# Patient Record
Sex: Female | Born: 1955 | Race: Black or African American | Hispanic: No | Smoking: Former smoker
Health system: Southern US, Community
[De-identification: ages and names within clinical notes are randomized; demographics above are authoritative.]

## PROBLEM LIST (undated history)

## (undated) DIAGNOSIS — T8859XA Other complications of anesthesia, initial encounter: Secondary | ICD-10-CM

## (undated) DIAGNOSIS — F419 Anxiety disorder, unspecified: Secondary | ICD-10-CM

## (undated) DIAGNOSIS — F329 Major depressive disorder, single episode, unspecified: Secondary | ICD-10-CM

## (undated) DIAGNOSIS — T4145XA Adverse effect of unspecified anesthetic, initial encounter: Secondary | ICD-10-CM

## (undated) DIAGNOSIS — R011 Cardiac murmur, unspecified: Secondary | ICD-10-CM

## (undated) DIAGNOSIS — E669 Obesity, unspecified: Secondary | ICD-10-CM

## (undated) DIAGNOSIS — Z9889 Other specified postprocedural states: Secondary | ICD-10-CM

## (undated) DIAGNOSIS — F32A Depression, unspecified: Secondary | ICD-10-CM

## (undated) DIAGNOSIS — R112 Nausea with vomiting, unspecified: Secondary | ICD-10-CM

## (undated) DIAGNOSIS — G709 Myoneural disorder, unspecified: Secondary | ICD-10-CM

## (undated) DIAGNOSIS — M1990 Unspecified osteoarthritis, unspecified site: Secondary | ICD-10-CM

## (undated) DIAGNOSIS — I10 Essential (primary) hypertension: Secondary | ICD-10-CM

## (undated) HISTORY — DX: Obesity, unspecified: E66.9

## (undated) HISTORY — PX: TUBAL LIGATION: SHX77

## (undated) HISTORY — PX: ABDOMINAL HYSTERECTOMY: SHX81

## (undated) HISTORY — PX: KNEE ARTHROSCOPY: SUR90

## (undated) HISTORY — PX: DILATION AND CURETTAGE OF UTERUS: SHX78

---

## 2003-11-28 ENCOUNTER — Other Ambulatory Visit: Admission: RE | Admit: 2003-11-28 | Discharge: 2003-11-28 | Payer: Self-pay | Admitting: Obstetrics and Gynecology

## 2004-01-03 ENCOUNTER — Encounter: Admission: RE | Admit: 2004-01-03 | Discharge: 2004-01-03 | Payer: Self-pay | Admitting: Obstetrics and Gynecology

## 2004-01-16 ENCOUNTER — Ambulatory Visit (HOSPITAL_COMMUNITY): Admission: RE | Admit: 2004-01-16 | Discharge: 2004-01-16 | Payer: Self-pay | Admitting: Interventional Radiology

## 2004-02-12 ENCOUNTER — Observation Stay (HOSPITAL_COMMUNITY): Admission: RE | Admit: 2004-02-12 | Discharge: 2004-02-13 | Payer: Self-pay | Admitting: Interventional Radiology

## 2004-09-12 ENCOUNTER — Encounter: Admission: RE | Admit: 2004-09-12 | Discharge: 2004-09-12 | Payer: Self-pay | Admitting: Obstetrics and Gynecology

## 2004-09-12 ENCOUNTER — Encounter: Admission: RE | Admit: 2004-09-12 | Discharge: 2004-09-12 | Payer: Self-pay | Admitting: Family Medicine

## 2005-11-17 ENCOUNTER — Encounter: Admission: RE | Admit: 2005-11-17 | Discharge: 2005-11-17 | Payer: Self-pay | Admitting: Obstetrics and Gynecology

## 2006-11-25 ENCOUNTER — Encounter: Admission: RE | Admit: 2006-11-25 | Discharge: 2006-11-25 | Payer: Self-pay | Admitting: Obstetrics and Gynecology

## 2007-03-15 ENCOUNTER — Emergency Department (HOSPITAL_COMMUNITY): Admission: EM | Admit: 2007-03-15 | Discharge: 2007-03-15 | Payer: Self-pay | Admitting: Emergency Medicine

## 2008-02-08 ENCOUNTER — Encounter: Admission: RE | Admit: 2008-02-08 | Discharge: 2008-02-08 | Payer: Self-pay | Admitting: Obstetrics and Gynecology

## 2008-02-29 LAB — HM PAP SMEAR

## 2009-04-11 ENCOUNTER — Encounter: Admission: RE | Admit: 2009-04-11 | Discharge: 2009-04-11 | Payer: Self-pay | Admitting: Internal Medicine

## 2010-01-26 ENCOUNTER — Inpatient Hospital Stay (HOSPITAL_COMMUNITY): Admission: AD | Admit: 2010-01-26 | Discharge: 2010-01-26 | Payer: Self-pay | Admitting: Obstetrics & Gynecology

## 2010-04-23 ENCOUNTER — Encounter: Admission: RE | Admit: 2010-04-23 | Discharge: 2010-04-23 | Payer: Self-pay | Admitting: Obstetrics and Gynecology

## 2010-09-20 ENCOUNTER — Encounter: Payer: Self-pay | Admitting: Interventional Radiology

## 2010-09-21 ENCOUNTER — Encounter: Payer: Self-pay | Admitting: Obstetrics and Gynecology

## 2010-09-22 ENCOUNTER — Encounter: Payer: Self-pay | Admitting: Obstetrics and Gynecology

## 2010-10-06 ENCOUNTER — Other Ambulatory Visit: Payer: Self-pay | Admitting: Obstetrics and Gynecology

## 2010-10-06 DIAGNOSIS — R102 Pelvic and perineal pain: Secondary | ICD-10-CM

## 2010-10-10 ENCOUNTER — Other Ambulatory Visit: Payer: Self-pay

## 2010-10-18 ENCOUNTER — Ambulatory Visit
Admission: RE | Admit: 2010-10-18 | Discharge: 2010-10-18 | Disposition: A | Discharge status: Home / Self Care | Payer: 59 | Source: Ambulatory Visit | Attending: Obstetrics and Gynecology | Admitting: Obstetrics and Gynecology

## 2010-10-18 DIAGNOSIS — R102 Pelvic and perineal pain: Secondary | ICD-10-CM

## 2010-10-18 MED ORDER — GADOBENATE DIMEGLUMINE 529 MG/ML IV SOLN
20.0000 mL | Freq: Once | INTRAVENOUS | Status: AC | PRN
  Administered 2010-10-18: 20 mL via INTRAVENOUS

## 2010-11-04 ENCOUNTER — Other Ambulatory Visit (HOSPITAL_COMMUNITY): Payer: 59

## 2010-11-11 ENCOUNTER — Encounter (HOSPITAL_COMMUNITY)
Admission: RE | Admit: 2010-11-11 | Discharge: 2010-11-11 | Disposition: A | Discharge status: Home / Self Care | Payer: 59 | Source: Ambulatory Visit | Attending: Obstetrics and Gynecology | Admitting: Obstetrics and Gynecology

## 2010-11-11 LAB — SURGICAL PCR SCREEN
MRSA, PCR: NEGATIVE
Staphylococcus aureus: NEGATIVE

## 2010-11-11 LAB — BASIC METABOLIC PANEL
BUN: 9 mg/dL (ref 6–23)
GFR calc Af Amer: >60 mL/min (ref >60)
Potassium: 3.3 mEq/L — ABNORMAL LOW (ref 3.5–5.1)
Sodium: 136 mEq/L (ref 135–145)

## 2010-11-11 LAB — CBC
HCT: 37.2 % (ref 36.0–46.0)
MCV: 79.8 fL (ref 78.0–100.0)
Platelets: 204 10*3/uL (ref 150–400)
RDW: 14.2 % (ref 11.5–15.5)

## 2010-11-17 ENCOUNTER — Ambulatory Visit (HOSPITAL_COMMUNITY)
Admission: RE | Admit: 2010-11-17 | Discharge: 2010-11-18 | Disposition: A | Discharge status: Home / Self Care | Payer: 59 | Source: Ambulatory Visit | Attending: Obstetrics and Gynecology | Admitting: Obstetrics and Gynecology

## 2010-11-17 ENCOUNTER — Other Ambulatory Visit: Payer: Self-pay | Admitting: Obstetrics and Gynecology

## 2010-11-17 DIAGNOSIS — Z01818 Encounter for other preprocedural examination: Secondary | ICD-10-CM | POA: Insufficient documentation

## 2010-11-17 DIAGNOSIS — D251 Intramural leiomyoma of uterus: Secondary | ICD-10-CM | POA: Insufficient documentation

## 2010-11-17 DIAGNOSIS — R1031 Right lower quadrant pain: Secondary | ICD-10-CM | POA: Insufficient documentation

## 2010-11-17 DIAGNOSIS — Z01812 Encounter for preprocedural laboratory examination: Secondary | ICD-10-CM | POA: Insufficient documentation

## 2010-11-17 LAB — URINE MICROSCOPIC-ADD ON

## 2010-11-17 LAB — URINALYSIS, ROUTINE W REFLEX MICROSCOPIC
Bilirubin Urine: NEGATIVE
Nitrite: NEGATIVE
Specific Gravity, Urine: 1.02 (ref 1.005–1.030)
Urobilinogen, UA: 0.2 mg/dL (ref 0.0–1.0)

## 2010-11-18 LAB — GLUCOSE, CAPILLARY
Glucose-Capillary: 184 mg/dL — ABNORMAL HIGH (ref 70–99)
Glucose-Capillary: 193 mg/dL — ABNORMAL HIGH (ref 70–99)

## 2010-11-18 LAB — BASIC METABOLIC PANEL
CO2: 27 mEq/L (ref 19–32)
Calcium: 8.6 mg/dL (ref 8.4–10.5)
Chloride: 104 mEq/L (ref 96–112)
GFR calc Af Amer: >60 mL/min (ref >60)
Potassium: 3.5 mEq/L (ref 3.5–5.1)
Sodium: 137 mEq/L (ref 135–145)

## 2010-11-18 LAB — CBC
Hemoglobin: 10.5 g/dL — ABNORMAL LOW (ref 12.0–15.0)
MCHC: 33.1 g/dL (ref 30.0–36.0)
RBC: 3.94 MIL/uL (ref 3.87–5.11)
WBC: 9.6 10*3/uL (ref 4.0–10.5)

## 2010-12-11 NOTE — Op Note (Signed)
NAME:  Connie Nichols, Connie Nichols             ACCOUNT NO.:  [id number]  MEDICAL RECORD NO.:  [id number]           PATIENT TYPE:  O  LOCATION:  9315                          FACILITY:  [facility]  PHYSICIAN:  Maxie Better, M.D.DATE OF BIRTH:  1956/01/10  DATE OF PROCEDURE:  11/17/2010 DATE OF DISCHARGE:                              OPERATIVE REPORT   PREOPERATIVE DIAGNOSES:  Chronic right lower quadrant pain, fibroid uterus.  POSTOPERATIVE DIAGNOSES:  Chronic right lower quadrant pain, parasitic right fibroid, fibroid uterus.  PROCEDURES:  Davinci robotic total laparoscopic hysterectomy, myomectomy.  SURGEON:  Maxie Better, MD  ASSISTANT:  Genia Del, MD  ANESTHESIA:  General.  PROCEDURE:  Under adequate general anesthesia, the patient was placed in dorsal lithotomy position.  She was sterilely prepped and draped in usual fashion.  An indwelling Foley catheter was sterilely placed. Weighted speculum was placed in the vagina.  Sims retractor was used anteriorly.  The cervix measured in width 3.5 cm.  The anterior lip of the cervix was grasped with a single-tooth tenaculum.  The uterus sounded to 7 cm.  The cervix was able to accept a 22 Pratt dilator. Based on the measurements, a medium size Rumi KOH ring was utilized and attached to a #6 introducer and this was then inserted into the uterine cavity using a new Rumi apparatus.  The ring was able to be seated around the cervix very nicely.  The balloon was filled.  Intrauterine instruments were removed from the vagina.  Attention was then turned to the abdomen maintaining still sterile technique.  The patient has been draped.  A supraumbilical transverse incision was then made and 0.25% Marcaine had been injected for local.  Incision was then made.  Veress needle was introduced and tested.  Opening pressure of 6 was noted.  3.5 L of CO2 was insufflated.  Veress needle was then removed.  A 12-mm trocar with sleeve was  introduced into the abdomen without incident. The robotic camera was then introduced through that port.  The anterior abdominal wall was inspected, the patient was placed in Trendelenburg. With manipulation of the instruments in the uterus,the uterus was then noted to have slightly enlarged and clearly a large fibroid proximal to the uterus and on the right lateral sidewall separate from the right tube and ovary. Once the upper abdomen was noted to be normal, normal liver edge and gallbladder was seen.  The additional ports were then placed based on the fibroid that was separate and two 8-mm ports were placed onto the left of the camera site and to the right where another 8 mm and distal to that a slightly inferior was a 12 mm assistant port; all placed under direct visualization.  At that point, the robot was then brought to the patient's left side, docked to these ports and the PK dissector, the Prograsper for the third arm and the monopolar scissors were placed to the level of where the operating sites would be.  At that point, I then proceeded to the surgical console and the pelvis was again inspected. It was then noted that the uterus had been having some fibroids.  Both  ovaries were normal.  The left tube was obscured by omental adhesions overlying the left side.  There was clearly evidence of surgical separation of the tubes in the past.  The posterior cul-de-sac was without any lesions.  The anterior cul-de-sac also without any lesions. The procedure started by using the PK dissector to clamp the right utero- ovarian ligament cauterizing and then subsequently cutting with the scissors.  The ureters had been seen bilaterally and was deep in the pelvis.  The round ligament was subsequently also clamped, cauterized, and cut.  This was continued to the level of the bladder reflection. The large fibroid that was separate was left to be removed at a separate time.  The vesicouterine  peritoneum was then opened transversely.  The cup was kept in the field in terms of the location with respect to the bladder.  Sharp and blunt dissection of the bladder was then performed on the opposite side.  The adhesions were then first lysed off the left aspect of the pelvis, resulting in the left tube and ovary being seen more clearly.  The left utero-ovarian ligaments were then sequentially clamped, cauterized, and then cut as well as the round and this was carried down also the bladder reflection and the bladder reflection was then completely opened at that point.  The bladder was then dissected out further off the lower uterine segment.  Once this was done, the uterine vessels were bilaterally isolated, they was serially clamped, cauterized, and ultimately cut.  Once the uterus was noted to be dusky, the bladder was downed further enough, the balloon was insufflated in the vagina to continue the pneumoperitoneum.  Scissor was then used to transect the cervix from its vaginal attachment circumferentially.  Once that was removed, the balloon was deflated and the uterus was then brought into the vagina to continue the pneumoperitoneum.  The vaginal cuff was inspected and bleeders was noted to be minimal.  At that point, attention was then turned to this isolated fibroid.  A tenaculum forceps was then used to grasp the fibroid, the hot scissors was then used to open the peritoneal surface, and dissection was then performed with adjustment of the tenaculum to further pull the fibroid into the field. At the base, there was a large vessel, which was clamped, cauterized, and cut and after that was done, the remaining portion of the fibroid attachment was then severed.  Good hemostasis noted at that point.  The uterus was removed.  The fibroid was then placed through the vaginal cuff and with  pushing from above and tenaculum from the vaginal region, the fibroid was then removed.  After  that was done, the vaginal cuff was then closed with figure-of-eight 0-Vicryl sutures.  The pelvis was then irrigated and suctioned.  Good hemostasis was noted.  At that point, the instruments were then removed, the robot was undocked, the abdomen was partially deflated.  The camera remained and was inspected.  The pelvis once again confirmed good hemostasis.  The ports were then removed under direct visualization and supraumbilical site was also removed.  The sites were then closed with 0-Vicryl fascial stitch, figure-of-eights, and the skin approximated with 4-0 Vicryl subcuticular sutures and Dermabond. Specimen was uterus and cervix and myoma.  The myoma itself weighed 100 g, all sent to Pathology.  Estimated blood loss was 50 mL. Intraoperative fluid 1900 mL.  Urine output 300 mL of clear urine. Sponge and instrument counts were correct.  Complication was none.  The patient  tolerated the procedure well and was transferred to recovery room in stable condition.     Maxie Better, M.D.     [place]  D:  11/17/2010  T:  11/18/2010  Job:  [id number]  Electronically Signed by Nena Jordan Daryl Quiros M.D. on 12/11/2010 12:59:23 AM

## 2011-06-11 ENCOUNTER — Other Ambulatory Visit: Payer: Self-pay | Admitting: Obstetrics and Gynecology

## 2011-06-11 DIAGNOSIS — Z1231 Encounter for screening mammogram for malignant neoplasm of breast: Secondary | ICD-10-CM

## 2011-06-15 LAB — DIFFERENTIAL
Basophils Absolute: 0
Lymphocytes Relative: 45
Lymphs Abs: 2.2
Monocytes Absolute: 0.4
Neutro Abs: 2.2

## 2011-06-15 LAB — URINE MICROSCOPIC-ADD ON

## 2011-06-15 LAB — URINALYSIS, ROUTINE W REFLEX MICROSCOPIC
Nitrite: NEGATIVE
Specific Gravity, Urine: 1.02
Urobilinogen, UA: 0.2

## 2011-06-15 LAB — I-STAT 8, (EC8 V) (CONVERTED LAB)
BUN: 11
Glucose, Bld: 155 — ABNORMAL HIGH
Hemoglobin: 12.6
Potassium: 3.4 — ABNORMAL LOW
Sodium: 139

## 2011-06-15 LAB — CBC
Hemoglobin: 12.1
RDW: 13.7
WBC: 4.9

## 2011-06-15 LAB — POCT I-STAT CREATININE: Creatinine, Ser: 0.7

## 2011-06-15 LAB — D-DIMER, QUANTITATIVE: D-Dimer, Quant: 0.94 — ABNORMAL HIGH

## 2011-06-25 ENCOUNTER — Ambulatory Visit: Payer: 59

## 2011-07-14 ENCOUNTER — Ambulatory Visit
Admission: RE | Admit: 2011-07-14 | Discharge: 2011-07-14 | Disposition: A | Discharge status: Home / Self Care | Payer: 59 | Source: Ambulatory Visit | Attending: Obstetrics and Gynecology | Admitting: Obstetrics and Gynecology

## 2011-07-14 DIAGNOSIS — Z1231 Encounter for screening mammogram for malignant neoplasm of breast: Secondary | ICD-10-CM

## 2011-07-28 ENCOUNTER — Other Ambulatory Visit: Payer: Self-pay | Admitting: Internal Medicine

## 2011-08-12 ENCOUNTER — Other Ambulatory Visit: Payer: Self-pay | Admitting: Neurological Surgery

## 2011-08-13 ENCOUNTER — Encounter (HOSPITAL_COMMUNITY): Payer: Self-pay

## 2011-08-20 ENCOUNTER — Encounter (HOSPITAL_COMMUNITY): Payer: Self-pay

## 2011-08-20 ENCOUNTER — Encounter (HOSPITAL_COMMUNITY)
Admission: RE | Admit: 2011-08-20 | Discharge: 2011-08-20 | Disposition: A | Discharge status: Home / Self Care | Payer: 59 | Source: Ambulatory Visit | Attending: Neurological Surgery | Admitting: Neurological Surgery

## 2011-08-20 ENCOUNTER — Other Ambulatory Visit: Payer: Self-pay

## 2011-08-20 HISTORY — DX: Unspecified osteoarthritis, unspecified site: M19.90

## 2011-08-20 HISTORY — DX: Other specified postprocedural states: R11.2

## 2011-08-20 HISTORY — DX: Adverse effect of unspecified anesthetic, initial encounter: T41.45XA

## 2011-08-20 HISTORY — DX: Other specified postprocedural states: Z98.890

## 2011-08-20 HISTORY — DX: Cardiac murmur, unspecified: R01.1

## 2011-08-20 HISTORY — DX: Major depressive disorder, single episode, unspecified: F32.9

## 2011-08-20 HISTORY — DX: Depression, unspecified: F32.A

## 2011-08-20 HISTORY — DX: Essential (primary) hypertension: I10

## 2011-08-20 HISTORY — DX: Anxiety disorder, unspecified: F41.9

## 2011-08-20 HISTORY — DX: Myoneural disorder, unspecified: G70.9

## 2011-08-20 HISTORY — DX: Other complications of anesthesia, initial encounter: T88.59XA

## 2011-08-20 LAB — BASIC METABOLIC PANEL
BUN: 8 mg/dL (ref 6–23)
Chloride: 100 mEq/L (ref 96–112)
GFR calc Af Amer: >90 mL/min (ref >90)
Potassium: 3.7 mEq/L (ref 3.5–5.1)
Sodium: 139 mEq/L (ref 135–145)

## 2011-08-20 LAB — CBC
HCT: 36.8 % (ref 36.0–46.0)
Hemoglobin: 12.7 g/dL (ref 12.0–15.0)
RBC: 4.67 MIL/uL (ref 3.87–5.11)
WBC: 6.1 10*3/uL (ref 4.0–10.5)

## 2011-08-20 LAB — SURGICAL PCR SCREEN: Staphylococcus aureus: NEGATIVE

## 2011-08-20 MED ORDER — CEFAZOLIN SODIUM 1-5 GM-% IV SOLN: 1.0000 g | INTRAVENOUS | Status: DC

## 2011-08-20 NOTE — Pre-Procedure Instructions (Signed)
20 Connie Nichols  08/20/2011   Your procedure is scheduled on:  1/08.2013  Report to [facility] at 1230 AM.  Call this number if you have problems the morning of surgery: [phone]   Remember:   Do not eat food:After Midnight.  May have clear liquids: up to 4 Hours before arrival.  Clear liquids include soda, tea, black coffee, apple or grape juice, broth.  Take these medicines the morning of surgery with A SIP OF WATER: AMLODIPINE  DIOVAN   Do not wear jewelry, make-up or nail polish.  Do not wear lotions, powders, or perfumes. You may wear deodorant.  Do not shave 48 hours prior to surgery.  Do not bring valuables to the hospital.  Contacts, dentures or bridgework may not be worn into surgery.  Leave suitcase in the car. After surgery it may be brought to your room.  For patients admitted to the hospital, checkout time is 11:00 AM the day of discharge.   Patients discharged the day of surgery will not be allowed to drive home.  Name and phone number of your driver: FAMILY  Special Instructions: CHG Shower Use Special Wash: 1/2 bottle night before surgery and 1/2 bottle morning of surgery.   Please read over the following fact sheets that you were given: Pain Booklet, Coughing and Deep Breathing, MRSA Information and Surgical Site Infection Prevention

## 2011-08-20 NOTE — Progress Notes (Signed)
Called pt at home #  No ans  L.m. To call me if not coming for pre appt.

## 2011-09-08 ENCOUNTER — Ambulatory Visit (HOSPITAL_COMMUNITY): Payer: 59 | Admitting: Anesthesiology

## 2011-09-08 ENCOUNTER — Encounter (HOSPITAL_COMMUNITY): Payer: Self-pay | Admitting: Anesthesiology

## 2011-09-08 ENCOUNTER — Encounter (HOSPITAL_COMMUNITY)
Admission: RE | Disposition: A | Discharge status: Home / Self Care | Payer: Self-pay | Source: Ambulatory Visit | Attending: Neurological Surgery

## 2011-09-08 ENCOUNTER — Ambulatory Visit (HOSPITAL_COMMUNITY)
Admission: RE | Admit: 2011-09-08 | Discharge: 2011-09-09 | Disposition: A | Discharge status: Home / Self Care | Payer: 59 | Source: Ambulatory Visit | Attending: Neurological Surgery | Admitting: Neurological Surgery

## 2011-09-08 ENCOUNTER — Ambulatory Visit (HOSPITAL_COMMUNITY): Payer: 59

## 2011-09-08 DIAGNOSIS — K7460 Unspecified cirrhosis of liver: Secondary | ICD-10-CM | POA: Insufficient documentation

## 2011-09-08 DIAGNOSIS — Z23 Encounter for immunization: Secondary | ICD-10-CM | POA: Insufficient documentation

## 2011-09-08 DIAGNOSIS — Z01812 Encounter for preprocedural laboratory examination: Secondary | ICD-10-CM | POA: Insufficient documentation

## 2011-09-08 DIAGNOSIS — F411 Generalized anxiety disorder: Secondary | ICD-10-CM | POA: Insufficient documentation

## 2011-09-08 DIAGNOSIS — I10 Essential (primary) hypertension: Secondary | ICD-10-CM | POA: Insufficient documentation

## 2011-09-08 DIAGNOSIS — Z01810 Encounter for preprocedural cardiovascular examination: Secondary | ICD-10-CM | POA: Insufficient documentation

## 2011-09-08 DIAGNOSIS — F3289 Other specified depressive episodes: Secondary | ICD-10-CM | POA: Insufficient documentation

## 2011-09-08 DIAGNOSIS — M4712 Other spondylosis with myelopathy, cervical region: Secondary | ICD-10-CM

## 2011-09-08 DIAGNOSIS — E119 Type 2 diabetes mellitus without complications: Secondary | ICD-10-CM | POA: Insufficient documentation

## 2011-09-08 DIAGNOSIS — Z01818 Encounter for other preprocedural examination: Secondary | ICD-10-CM | POA: Insufficient documentation

## 2011-09-08 DIAGNOSIS — F329 Major depressive disorder, single episode, unspecified: Secondary | ICD-10-CM | POA: Insufficient documentation

## 2011-09-08 HISTORY — PX: ANTERIOR CERVICAL DECOMP/DISCECTOMY FUSION: SHX1161

## 2011-09-08 LAB — GLUCOSE, CAPILLARY
Glucose-Capillary: 135 mg/dL — ABNORMAL HIGH (ref 70–99)
Glucose-Capillary: 178 mg/dL — ABNORMAL HIGH (ref 70–99)

## 2011-09-08 SURGERY — ANTERIOR CERVICAL DECOMPRESSION/DISCECTOMY FUSION 2 LEVELS
Anesthesia: General | Site: Neck | Wound class: Clean

## 2011-09-08 MED ORDER — VANCOMYCIN HCL IN DEXTROSE 1-5 GM/200ML-% IV SOLN
INTRAVENOUS | Status: AC
  Filled 2011-09-08: qty 200

## 2011-09-08 MED ORDER — FENTANYL CITRATE 0.05 MG/ML IJ SOLN
25.0000 ug | INTRAMUSCULAR | Status: DC | PRN
  Administered 2011-09-08 (×3): 25 ug via INTRAVENOUS
  Administered 2011-09-08: 50 ug via INTRAVENOUS
  Administered 2011-09-08: 25 ug via INTRAVENOUS

## 2011-09-08 MED ORDER — SODIUM CHLORIDE 0.9 % IV SOLN
INTRAVENOUS | Status: AC
  Filled 2011-09-08: qty 500

## 2011-09-08 MED ORDER — NEOSTIGMINE METHYLSULFATE 1 MG/ML IJ SOLN
INTRAMUSCULAR | Status: DC | PRN
  Administered 2011-09-08: 3 mg via INTRAVENOUS

## 2011-09-08 MED ORDER — FENTANYL CITRATE 0.05 MG/ML IJ SOLN
INTRAMUSCULAR | Status: AC
  Filled 2011-09-08: qty 2

## 2011-09-08 MED ORDER — LIDOCAINE-EPINEPHRINE 1 %-1:100000 IJ SOLN
INTRAMUSCULAR | Status: DC | PRN
  Administered 2011-09-08: 20 mL

## 2011-09-08 MED ORDER — INFLUENZA VIRUS VACC SPLIT PF IM SUSP
0.5000 mL | INTRAMUSCULAR | Status: AC
  Administered 2011-09-09: 0.5 mL via INTRAMUSCULAR
  Filled 2011-09-08: qty 0.5

## 2011-09-08 MED ORDER — FENTANYL CITRATE 0.05 MG/ML IJ SOLN
INTRAMUSCULAR | Status: DC | PRN
  Administered 2011-09-08: 50 ug via INTRAVENOUS
  Administered 2011-09-08: 100 ug via INTRAVENOUS
  Administered 2011-09-08: 50 ug via INTRAVENOUS

## 2011-09-08 MED ORDER — SODIUM CHLORIDE 0.9 % IR SOLN
Status: DC | PRN
  Administered 2011-09-08: 15:00:00

## 2011-09-08 MED ORDER — INSULIN ASPART 100 UNIT/ML ~~LOC~~ SOLN
0.0000 [IU] | Freq: Every day | SUBCUTANEOUS | Status: DC
  Administered 2011-09-08: 4 [IU] via SUBCUTANEOUS

## 2011-09-08 MED ORDER — ACETAMINOPHEN 650 MG RE SUPP: 650.0000 mg | RECTAL | Status: DC | PRN

## 2011-09-08 MED ORDER — METFORMIN HCL 500 MG PO TABS
500.0000 mg | ORAL_TABLET | Freq: Every day | ORAL | Status: DC
  Administered 2011-09-08 – 2011-09-09 (×2): 500 mg via ORAL
  Filled 2011-09-08 (×2): qty 1

## 2011-09-08 MED ORDER — ONDANSETRON HCL 4 MG/2ML IJ SOLN
INTRAMUSCULAR | Status: DC | PRN
  Administered 2011-09-08 (×2): 4 mg via INTRAVENOUS

## 2011-09-08 MED ORDER — INSULIN ASPART 100 UNIT/ML ~~LOC~~ SOLN
0.0000 [IU] | Freq: Three times a day (TID) | SUBCUTANEOUS | Status: DC
  Administered 2011-09-09: 3 [IU] via SUBCUTANEOUS
  Filled 2011-09-08: qty 3

## 2011-09-08 MED ORDER — BACITRACIN 50000 UNITS IM SOLR
INTRAMUSCULAR | Status: AC
  Filled 2011-09-08: qty 50000

## 2011-09-08 MED ORDER — PROMETHAZINE HCL 25 MG/ML IJ SOLN
INTRAMUSCULAR | Status: AC
  Filled 2011-09-08: qty 1

## 2011-09-08 MED ORDER — HYDROCODONE-ACETAMINOPHEN 5-325 MG PO TABS
1.0000 | ORAL_TABLET | ORAL | Status: DC | PRN
  Administered 2011-09-08 – 2011-09-09 (×3): 2 via ORAL
  Filled 2011-09-08 (×4): qty 2

## 2011-09-08 MED ORDER — DEXAMETHASONE SODIUM PHOSPHATE 4 MG/ML IJ SOLN
INTRAMUSCULAR | Status: DC | PRN
  Administered 2011-09-08: 8 mg via INTRAVENOUS

## 2011-09-08 MED ORDER — LINAGLIPTIN 5 MG PO TABS
5.0000 mg | ORAL_TABLET | Freq: Every day | ORAL | Status: DC
  Administered 2011-09-08 – 2011-09-09 (×2): 5 mg via ORAL
  Filled 2011-09-08 (×2): qty 1

## 2011-09-08 MED ORDER — OLMESARTAN 10 MG HALF TABLET
10.0000 mg | ORAL_TABLET | Freq: Every day | ORAL | Status: DC
  Administered 2011-09-09: 10 mg via ORAL
  Filled 2011-09-08 (×2): qty 1

## 2011-09-08 MED ORDER — VITAMIN D3 25 MCG (1000 UNIT) PO TABS
1000.0000 [IU] | ORAL_TABLET | Freq: Every day | ORAL | Status: DC
  Administered 2011-09-08 – 2011-09-09 (×2): 1000 [IU] via ORAL
  Filled 2011-09-08 (×2): qty 1

## 2011-09-08 MED ORDER — DIAZEPAM 5 MG PO TABS
5.0000 mg | ORAL_TABLET | Freq: Four times a day (QID) | ORAL | Status: DC | PRN
  Administered 2011-09-09: 5 mg via ORAL
  Filled 2011-09-08: qty 1

## 2011-09-08 MED ORDER — VANCOMYCIN HCL 1000 MG IV SOLR
1000.0000 mg | INTRAVENOUS | Status: DC | PRN
  Administered 2011-09-08: 1000 mg via INTRAVENOUS

## 2011-09-08 MED ORDER — BUPIVACAINE HCL (PF) 0.5 % IJ SOLN
INTRAMUSCULAR | Status: DC | PRN
  Administered 2011-09-08: 30 mL

## 2011-09-08 MED ORDER — 0.9 % SODIUM CHLORIDE (POUR BTL) OPTIME
TOPICAL | Status: DC | PRN
  Administered 2011-09-08: 1000 mL

## 2011-09-08 MED ORDER — PROPOFOL 10 MG/ML IV EMUL
INTRAVENOUS | Status: DC | PRN
  Administered 2011-09-08: 170 mg via INTRAVENOUS

## 2011-09-08 MED ORDER — HEMOSTATIC AGENTS (NO CHARGE) OPTIME
TOPICAL | Status: DC | PRN
  Administered 2011-09-08: 1 via TOPICAL

## 2011-09-08 MED ORDER — PHENYLEPHRINE HCL 10 MG/ML IJ SOLN
INTRAMUSCULAR | Status: DC | PRN
  Administered 2011-09-08 (×4): 80 ug via INTRAVENOUS

## 2011-09-08 MED ORDER — LIDOCAINE HCL (CARDIAC) 20 MG/ML IV SOLN
INTRAVENOUS | Status: DC | PRN
  Administered 2011-09-08: 60 mg via INTRAVENOUS

## 2011-09-08 MED ORDER — SODIUM CHLORIDE 0.9 % IJ SOLN: 3.0000 mL | INTRAMUSCULAR | Status: DC | PRN

## 2011-09-08 MED ORDER — AMLODIPINE BESYLATE 5 MG PO TABS
5.0000 mg | ORAL_TABLET | Freq: Every day | ORAL | Status: DC
  Administered 2011-09-09: 5 mg via ORAL
  Filled 2011-09-08 (×2): qty 1

## 2011-09-08 MED ORDER — THERA M PLUS PO TABS
1.0000 | ORAL_TABLET | Freq: Every day | ORAL | Status: DC
  Administered 2011-09-08 – 2011-09-09 (×2): 1 via ORAL
  Filled 2011-09-08 (×2): qty 1

## 2011-09-08 MED ORDER — MORPHINE SULFATE 4 MG/ML IJ SOLN: 1.0000 mg | INTRAMUSCULAR | Status: DC | PRN

## 2011-09-08 MED ORDER — ROCURONIUM BROMIDE 100 MG/10ML IV SOLN
INTRAVENOUS | Status: DC | PRN
  Administered 2011-09-08: 50 mg via INTRAVENOUS
  Administered 2011-09-08: 20 mg via INTRAVENOUS

## 2011-09-08 MED ORDER — ALUM & MAG HYDROXIDE-SIMETH 200-200-20 MG/5ML PO SUSP: 30.0000 mL | Freq: Four times a day (QID) | ORAL | Status: DC | PRN

## 2011-09-08 MED ORDER — SODIUM CHLORIDE 0.9 % IV SOLN
10.0000 mg | INTRAVENOUS | Status: DC | PRN
  Administered 2011-09-08: 10 ug/min via INTRAVENOUS

## 2011-09-08 MED ORDER — PROMETHAZINE HCL 25 MG/ML IJ SOLN
6.2500 mg | INTRAMUSCULAR | Status: DC | PRN
  Administered 2011-09-08: 6.25 mg via INTRAVENOUS

## 2011-09-08 MED ORDER — SODIUM CHLORIDE 0.9 % IV SOLN: 250.0000 mL | INTRAVENOUS | Status: DC

## 2011-09-08 MED ORDER — GLYCOPYRROLATE 0.2 MG/ML IJ SOLN
INTRAMUSCULAR | Status: DC | PRN
  Administered 2011-09-08: .6 mg via INTRAVENOUS

## 2011-09-08 MED ORDER — MEPERIDINE HCL 25 MG/ML IJ SOLN: 6.2500 mg | INTRAMUSCULAR | Status: DC | PRN

## 2011-09-08 MED ORDER — ACETAMINOPHEN 325 MG PO TABS: 650.0000 mg | ORAL_TABLET | ORAL | Status: DC | PRN

## 2011-09-08 MED ORDER — LACTATED RINGERS IV SOLN
INTRAVENOUS | Status: DC | PRN
  Administered 2011-09-08 (×2): via INTRAVENOUS

## 2011-09-08 MED ORDER — PHENOL 1.4 % MT LIQD: 1.0000 | OROMUCOSAL | Status: DC | PRN

## 2011-09-08 MED ORDER — THROMBIN 5000 UNITS EX KIT
PACK | CUTANEOUS | Status: DC | PRN
  Administered 2011-09-08 (×2): 5000 [IU] via TOPICAL

## 2011-09-08 MED ORDER — ROSUVASTATIN CALCIUM 10 MG PO TABS
10.0000 mg | ORAL_TABLET | Freq: Every day | ORAL | Status: DC
  Administered 2011-09-08 – 2011-09-09 (×2): 10 mg via ORAL
  Filled 2011-09-08 (×2): qty 1

## 2011-09-08 MED ORDER — MIDAZOLAM HCL 5 MG/5ML IJ SOLN
INTRAMUSCULAR | Status: DC | PRN
  Administered 2011-09-08: 2 mg via INTRAVENOUS

## 2011-09-08 MED ORDER — SODIUM CHLORIDE 0.9 % IJ SOLN: 3.0000 mL | Freq: Two times a day (BID) | INTRAMUSCULAR | Status: DC

## 2011-09-08 MED ORDER — MENTHOL 3 MG MT LOZG
1.0000 | LOZENGE | OROMUCOSAL | Status: DC | PRN
  Filled 2011-09-08: qty 9

## 2011-09-08 MED ORDER — LACTATED RINGERS IV SOLN: INTRAVENOUS | Status: DC

## 2011-09-08 MED ORDER — ONDANSETRON HCL 4 MG/2ML IJ SOLN: 4.0000 mg | INTRAMUSCULAR | Status: DC | PRN

## 2011-09-08 SURGICAL SUPPLY — 59 items
ADH SKN CLS APL DERMABOND .7 (GAUZE/BANDAGES/DRESSINGS) ×1
BAG DECANTER FOR FLEXI CONT (MISCELLANEOUS) ×2 IMPLANT
BANDAGE GAUZE ELAST BULKY 4 IN (GAUZE/BANDAGES/DRESSINGS) ×2 IMPLANT
BIT DRILL 2.3 12 FIXED (INSTRUMENTS) IMPLANT
BIT DRILL NEURO 2X3.1 SFT TUCH (MISCELLANEOUS) ×1 IMPLANT
BONE CERVICAL 6MM LRG (Bone Implant) ×2 IMPLANT
BUR BARREL STRAIGHT FLUTE 4.0 (BURR) ×2 IMPLANT
CANISTER SUCTION 2500CC (MISCELLANEOUS) ×2 IMPLANT
CLOTH BEACON ORANGE TIMEOUT ST (SAFETY) ×2 IMPLANT
CONT SPEC 4OZ CLIKSEAL STRL BL (MISCELLANEOUS) ×4 IMPLANT
DECANTER SPIKE VIAL GLASS SM (MISCELLANEOUS) ×1 IMPLANT
DERMABOND ADVANCED (GAUZE/BANDAGES/DRESSINGS) ×1
DERMABOND ADVANCED .7 DNX12 (GAUZE/BANDAGES/DRESSINGS) ×1 IMPLANT
DRAPE LAPAROTOMY 100X72 PEDS (DRAPES) ×2 IMPLANT
DRAPE MICROSCOPE LEICA (MISCELLANEOUS) IMPLANT
DRAPE POUCH INSTRU U-SHP 10X18 (DRAPES) ×2 IMPLANT
DRESSING TELFA 8X3 (GAUZE/BANDAGES/DRESSINGS) ×1 IMPLANT
DRILL 12MM (INSTRUMENTS) ×2
DRILL NEURO 2X3.1 SOFT TOUCH (MISCELLANEOUS) ×2
DRSG OPSITE 4X5.5 SM (GAUZE/BANDAGES/DRESSINGS) ×1 IMPLANT
DURAPREP 6ML APPLICATOR 50/CS (WOUND CARE) ×2 IMPLANT
ELECT REM PT RETURN 9FT ADLT (ELECTROSURGICAL) ×2
ELECTRODE REM PT RTRN 9FT ADLT (ELECTROSURGICAL) ×1 IMPLANT
GAUZE SPONGE 4X4 16PLY XRAY LF (GAUZE/BANDAGES/DRESSINGS) IMPLANT
GLOVE BIO SURGEON STRL SZ7.5 (GLOVE) IMPLANT
GLOVE BIOGEL PI IND STRL 7.5 (GLOVE) IMPLANT
GLOVE BIOGEL PI IND STRL 8.5 (GLOVE) ×1 IMPLANT
GLOVE BIOGEL PI INDICATOR 7.5 (GLOVE) ×2
GLOVE BIOGEL PI INDICATOR 8.5 (GLOVE) ×1
GLOVE ECLIPSE 7.5 STRL STRAW (GLOVE) ×3 IMPLANT
GLOVE ECLIPSE 8.5 STRL (GLOVE) ×2 IMPLANT
GLOVE EXAM NITRILE LRG STRL (GLOVE) IMPLANT
GLOVE EXAM NITRILE MD LF STRL (GLOVE) IMPLANT
GLOVE EXAM NITRILE XL STR (GLOVE) IMPLANT
GLOVE EXAM NITRILE XS STR PU (GLOVE) IMPLANT
GOWN BRE IMP SLV AUR LG STRL (GOWN DISPOSABLE) IMPLANT
GOWN BRE IMP SLV AUR XL STRL (GOWN DISPOSABLE) ×3 IMPLANT
GOWN STRL REIN 2XL LVL4 (GOWN DISPOSABLE) ×2 IMPLANT
HEAD HALTER (SOFTGOODS) ×2 IMPLANT
KIT BASIN OR (CUSTOM PROCEDURE TRAY) ×2 IMPLANT
KIT ROOM TURNOVER OR (KITS) ×2 IMPLANT
NDL SPNL 22GX3.5 QUINCKE BK (NEEDLE) ×1 IMPLANT
NEEDLE HYPO 22GX1.5 SAFETY (NEEDLE) ×2 IMPLANT
NEEDLE SPNL 22GX3.5 QUINCKE BK (NEEDLE) ×4 IMPLANT
NS IRRIG 1000ML POUR BTL (IV SOLUTION) ×2 IMPLANT
PACK LAMINECTOMY NEURO (CUSTOM PROCEDURE TRAY) ×2 IMPLANT
PAD ARMBOARD 7.5X6 YLW CONV (MISCELLANEOUS) ×6 IMPLANT
PLATE 28MM (Plate) ×1 IMPLANT
PUTTY BONE 2.5CC ×1 IMPLANT
RUBBERBAND STERILE (MISCELLANEOUS) IMPLANT
SCREW 12MM (Screw) ×6 IMPLANT
SPONGE GAUZE 4X4 12PLY (GAUZE/BANDAGES/DRESSINGS) ×1 IMPLANT
SPONGE INTESTINAL PEANUT (DISPOSABLE) ×2 IMPLANT
SPONGE SURGIFOAM ABS GEL SZ50 (HEMOSTASIS) ×2 IMPLANT
SUT VIC AB 3-0 SH 8-18 (SUTURE) ×3 IMPLANT
SYR 20ML ECCENTRIC (SYRINGE) ×2 IMPLANT
TOWEL OR 17X24 6PK STRL BLUE (TOWEL DISPOSABLE) ×2 IMPLANT
TOWEL OR 17X26 10 PK STRL BLUE (TOWEL DISPOSABLE) ×2 IMPLANT
WATER STERILE IRR 1000ML POUR (IV SOLUTION) ×2 IMPLANT

## 2011-09-08 NOTE — H&P (Signed)
Patient's films were reviewed with the patient including review of her symptoms. She has disease at C4-5 C5-6 and C6-C7 though at only C4-5 and C5-6 that she exhibit signs of cord compression and foraminal stenosis. She is to have surgical decompression at C4-5 and C5-6.

## 2011-09-08 NOTE — Progress Notes (Signed)
Subjective: Patient reports Stable postop offers no complaints some shoulder pain.  Objective: Vital signs in last 24 hours: Temp:  [97.6 F (36.4 C)-98.1 F (36.7 C)] 97.6 F (36.4 C) (01/08 1710) Pulse Rate:  [81-84] 81  (01/08 1710) Resp:  [15-18] 15  (01/08 1710) BP: (140-159)/(83-84) 140/84 mmHg (01/08 1710) SpO2:  [96 %-100 %] 100 % (01/08 1710)  Intake/Output from previous day:   Intake/Output this shift: Total I/O In: 1000 [I.V.:1000] Out: 50 [Blood:50]  Dressing clean and dry speech intact motor function intact in upper extremities.  Lab Results: No results found for this basename: WBC:2,HGB:2,HCT:2,PLT:2 in the last 72 hours BMET No results found for this basename: NA:2,K:2,CL:2,CO2:2,GLUCOSE:2,BUN:2,CREATININE:2,CALCIUM:2 in the last 72 hours  Studies/Results: Dg Cervical Spine 2-3 Views  09/08/2011  *RADIOLOGY REPORT*  Clinical Data: Neck pain.  CERVICAL SPINE - 2-3 VIEW  Comparison: Plain films 05/26/2011.  MRI 05/11/2011.  Findings: Film #1 demonstrates a needle at C3-C4.  Film #2 shows ACDF with anterior plating extending from C4 downward, reportedly to C6.  This cannot be independently confirmed on these images.  IMPRESSION: Intraoperative films demonstrating fusion beginning at C4 and extending downward.  Original Report Authenticated By: Elsie Stain, M.D.    Assessment/Plan: Stable postop  LOS: 0 days  Stable observe overnight.   Connie Nichols J 09/08/2011, 5:45 PM

## 2011-09-08 NOTE — Transfer of Care (Signed)
Immediate Anesthesia Transfer of Care Note  Patient: Connie Nichols  Procedure(s) Performed:  ANTERIOR CERVICAL DECOMPRESSION/DISCECTOMY FUSION 2 LEVELS - Cervical four-five, five-six anterior cervical decompression fusion  Patient Location: PACU  Anesthesia Type: General  Level of Consciousness: awake  Airway & Oxygen Therapy: Patient Spontanous Breathing and Patient connected to nasal cannula oxygen  Post-op Assessment: Report given to PACU RN  Post vital signs: Reviewed and stable  Complications: No apparent anesthesia complications

## 2011-09-08 NOTE — Anesthesia Procedure Notes (Signed)
Procedure Name: Intubation Date/Time: 09/08/2011 2:57 PM Performed by: Caryn Bee Pre-anesthesia Checklist: Patient identified, Emergency Drugs available, Suction available, Patient being monitored and Timeout performed Patient Re-evaluated:Patient Re-evaluated prior to inductionOxygen Delivery Method: Circle System Utilized Preoxygenation: Pre-oxygenation with 100% oxygen Intubation Type: IV induction Ventilation: Mask ventilation without difficulty Laryngoscope Size: Mac and 3 Grade View: Grade I Tube type: Oral Tube size: 7.0 mm Number of attempts: 1 Airway Equipment and Method: stylet Placement Confirmation: ETT inserted through vocal cords under direct vision,  positive ETCO2 and breath sounds checked- equal and bilateral Secured at: 20 cm Tube secured with: Tape Dental Injury: Teeth and Oropharynx as per pre-operative assessment

## 2011-09-08 NOTE — Preoperative (Signed)
Beta Blockers   Reason not to administer Beta Blockers:Not Applicable 

## 2011-09-08 NOTE — Op Note (Signed)
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 Asst.: Colon Branch M.D. Indications: Patient is a 56 year old individual who's had significant problems with neck shoulder and proximal arm pain she has evidence of advanced spondylitic disease at C4-5 with cord flattening and C5-6 where there is also compression of the spinal cord and by foraminal stenosis she has a modest weakness in the deltoids she has a severely limited range of motion of the neck show less some mild to moderate disease at C6-C7 where there is no evidence of canal compromise at that level she's been advised regarding the need for her to level anterior cervical decompression and arthrodesis.   Procedure: The patient was brought to the operating room placed on the table in supine position. After the smooth induction of general endotracheal anesthesia the neck was prepped with alcohol and DuraPrep. After sterile draping and appropriate timeout procedure a transverse incision was created in the left side of the neck and carried down to the platysma. The plane between the sternocleidomastoid and strap muscles dissected bluntly until the prevertebral space was reached. The first identifiable disc space was noted to be C3-4 on a localizing radiograph. The dissection was then undertaken in the longus coli muscle to allow placement of a self-retaining Caspar type retractor.  The anterior longitudinal ligament was opened at C4-5 and ventral osteophytes were removed with a Leksell rongeur and Kerrison punch. Interspace was cleared of significant quantity of the degenerated disc material in the region of the posterior  longitudinal ligament was reached. Dissection was carried out using a high-speed drill and 3-0 Karlin curettes. Uncinate processes were drilled down and removed and osteophytes from the inferior margin of the body of C4 were removed with a Kerrison 2 mm gold punch. After the central canal and lateral recesses were well decompressed the stasis was achieved with the bipolar cautery and some small pledgets of Gelfoam soaked in thrombin that were later irrigated away.  A 6 mm transgraft was then prepared by enlarging the central cavity and filling with demineralized bone matrix and placing into the interspace. C5-C6 Was decompressed and fused in a similar fashion .  Next the retractor was removed and a 28 mm trestle plate was placed over the vertebral bodies and secured with 12 millimeter variable angle screws. A final localizing radiograph identified the position of the surgical construct in its superior most aspect. Hemostasis was achieved in the soft tissues and then the platysma was closed with 3-0 Vicryl in an interrupted fashion and 3-0 Vicryl was used in the subcuticular tissue. Blood loss was estimated at 50 cc.

## 2011-09-08 NOTE — H&P (Signed)
Connie Nichols  #[id number] DOB:  Apr 29, 1956 08/12/2011:     Connie Nichols returns to the office today.  She has been seen and evaluated by Connie Nichols back on 05/26/2011 and at that time we noted that she had evidence of some spondylitic changes at C4-5 and C5-6 significantly with flattening of the cord and more mildly at C6-7.  Connie Nichols had suggested efforts at conservative management and discussed having her undergo some epidural steroid injections which were performed by Dr. Odette Nichols.  Connie Nichols tells me she had some brief relief with those injections for a couple of days at best and she is having persistent symptoms mostly of neck pain radiating into the shoulders and more recently she has been getting exacerbations of headaches.  Overall, she would like something more definitive done if at all possible as this process has been dragging on now for some time.    Today in the office, I had the opportunity to review her MRI of the cervical spine which was completed in September of this year.  The study demonstrates that Connie Nichols has evidence of moderate to severe spondylitic stenosis at C4-5 and C5-6 where there is evidence of cord flattening and particularly at C4-5 there is some lateral recess stenosis.  At C6-7, she has milder spondylitic change with bulging of the disc more eccentric to the left but the foramina appear adequately patent and the cord certainly is not being flattened or compressed.    In light of her symptoms being mostly centralized in the neck and shoulders and not exhibiting a whole lot of radicular symptoms, I suggested that she might be an appropriate candidate for a two level anterior decompression and arthrodesis.  This would involve an operation in the front of her neck through an incision on the left side of her neck dissecting down between the carotid artery and jugular vein on one side and the esophagus and trachea on the other side to reach the front of the neck and then removing  the discs which are degenerated along with significant bone spurs that have overgrown in her spine.  This is to decompress and make room for the spinal cord and nerve roots.  Thereafter in the gap, a bone graft is placed.  The bone graft comes from cadaver bone usually from the radius bone in the wrist and it fits into the space to hold the vertebra apart.  A titanium plate is placed over the vertebra to secure it in position to allow the bones to heal together.  I did note that the surgery eliminates the two joints that are most severely involved.  Generally, patients will experience relief of the pain so that they can then move better in their neck.  I would hope that this would alleviate the worst of the symptoms; however, I could not predict whether this is going to alleviate or have any effect on the patient's headache symptoms as so many things can cause headaches.    I did note on her exam today that Connie Nichols exhibits a range of motion of 30 degrees to the left and to the right which is significantly restricted along with flexion and extension limited to about 50% of normal.  She does have tenderness and pain in the scalene muscles and also in the trapezii on both sides which is exacerbated with direct pressure.  There is no fullness in the supraclavicular fossas and the motor strength appears good to confrontational testing of the deltoids,  biceps, triceps, grips and intrinsics.  We will proceed with surgical intervention to decompress and stabilize C4-5 and C5-6.  The surgery typically can be done with either a simple overnight stay or on an outpatient basis.  We will plan on scheduling the surgery at the earliest convenience for her.         Connie Nichols, M.D./gde    NEUROSURGICAL CONSULTATION   Connie Nichols  DOB:  1956/02/13 #[id number]    May 26, 2011   HISTORY:     Connie Nichols is a 56 year old female who sustained a twisting injury to her neck while she was at a work and  coworker through a stress ball at her, caused her to jump, and she wrenched her neck.  She had immediate pain and discomfort in the area of the cervical spine radiating into the right shoulder.  Nothing radicular all the way down the arm.  She has failed conservative care to include oral steroids, anti-inflammatories, muscle relaxers, pain medications, physical therapy and time. She has had a cervical MRI and was found to have multifactorial stenosis at multiple levels with multilevel spondylosis and she was referred to [place] for further treatment recommendations.  She has not had any epidural steroid injections. She has never had any surgery on her neck in the past.  She did have two remote flare-ups of neck pain in the past; one in 2002 when she slipped on some [place] the mall. She went to physical therapy and that resolved that situation.  In 2007 she slipped again and [facility]. After about a month she had resolution of that pain flare, but this pain flare has not settled down. She denies any numbness and tingling.  She denies any weakness. She denies any bowel or bladder problems.  No headaches, convulsions, seizures, or loss of consciousness.    PAST MEDICAL HISTORY:  Positive for diabetes, hypertension, hypercholesterolemia.  Negative for heart problems, stroke, GI problems, cancer, tumors or lung disease.  Previous surgeries - C-section, knee surgery, and a hysterectomy.  No known drug allergies.  Current medications are Diovan, Crestor, Januvia, Metformin and Amlodipine.    FAMILY HISTORY:    Her mother is deceased. Her father is 87 years old in fair health and has hypertension and diabetes.    SOCIAL HISTORY:    Negative tobacco use.  Positive alcohol use socially.  No history of substance abuse. She is 5'6", 214 lbs.    REVIEW OF SYSTEMS:   Review of systems x 14 is positive for neck pain. She has also tried Mobic, Ultram, and Naprosyn for her neck pain.     PHYSICAL EXAMINATION:  On physical exam, Connie Nichols is a well developed, well nourished, 56 year old female alert and oriented x 3.  Cranial nerves 2 through  Connie Nichols  DOB:  1955-10-14 #[id number]     May 26, 2011  Page Two  12 intact.  She has a BMI of 34.  She has good range of motion of the cervical spine, but with pain particularly with bending to the right and rotation to the right.  She has pain with extension and flexion.  She is neurovascularly intact in bilateral upper extremities. Grip strength 5/5.  Motor strength is 5/5 to grip, intrinsic, opposition, finger extension and wrist flexion, wrist extension, biceps, triceps, deltoid.  Brachioradialis, biceps, triceps reflexes are +2.  Negative Hoffmann's. She ambulates safely with a normal gait.  Sensibility is intact.  She has  multiple trigger points particularly in the paraspinous and periscapular region particularly on the right in her cervical spine and at the base of the cervical spine.   DIAGNOSTIC STUDIES:   Radiographs show some significant spondylitic changes, loss of lordosis, reverse of lordosis at C4-5, C5-6, and C6-7.  She has anterior spurring, loss of disc height and significant posterior spurring.    Her MRI shows some multifactorial stenosis and spondylitic changes most marked at C5-6 and C6-7, less severe at C4-5, but still substantial causing lateral recess and bilateral foraminal stenosis with facet hypertrophy.   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   PLAN:      We have had a long discussion regarding treatment recommendations. She has failed conservative care. She is not interested in surgery at this point in time. We could try an epidural steroid. I am not sure that this will calm down her symptomatology, but it may give her some pain relief particularly since she just has neck pain. We will set her up for an  injection with Dr. Ollen Bowl to see if this will calm down her symptomatology. We will see how she responds to this. If she gets adequate relief we can periodically repeat the injections.  If she does not, then we should see her back with Dr. Danielle Dess for surgical consultation for a possibility of anterior cervical decompression and fusion which I think would need to be three levels at C4-5, C5-6, and C6-7 to address her multifactorial spinal stenosis and spondylitic changes at these three levels and also to restore some of her lordosis.  All questions encouraged, answered, and addressed. Pathology reviewed using a model, her x-rays, her MRI, her physical exam, clinical finding and history.  The patient is seen today by Connie Negus, PA-C in the office.    [facility]   Connie Negus, PA-C Supervised by Connie Nichols, M.D.

## 2011-09-08 NOTE — Anesthesia Preprocedure Evaluation (Addendum)
Anesthesia Evaluation  Patient identified by MRN, date of birth, ID band Patient awake    Reviewed: Allergy & Precautions, H&P , NPO status , Patient's Chart, lab work & pertinent test results  History of Anesthesia Complications (+) PONV  Airway Mallampati: I      Dental   Pulmonary  clear to auscultation        Cardiovascular Exercise Tolerance: Good hypertension, Regular Normal    Neuro/Psych PSYCHIATRIC DISORDERS Anxiety Depression    GI/Hepatic negative GI ROS, Neg liver ROS, (+) Cirrhosis -       ,   Endo/Other  Diabetes mellitus-, Oral Hypoglycemic Agents  Renal/GU negative Renal ROS     Musculoskeletal negative musculoskeletal ROS (+)   Abdominal   Peds  Hematology negative hematology ROS (+)   Anesthesia Other Findings   Reproductive/Obstetrics                         Anesthesia Physical Anesthesia Plan  ASA: III  Anesthesia Plan: General   Post-op Pain Management:    Induction: Intravenous  Airway Management Planned: Oral ETT  Additional Equipment:   Intra-op Plan:   Post-operative Plan: Extubation in OR  Informed Consent: I have reviewed the patients History and Physical, chart, labs and discussed the procedure including the risks, benefits and alternatives for the proposed anesthesia with the patient or authorized representative who has indicated his/her understanding and acceptance.     Plan Discussed with:   Anesthesia Plan Comments:         Anesthesia Quick Evaluation

## 2011-09-08 NOTE — Anesthesia Postprocedure Evaluation (Signed)
  Anesthesia Post-op Note  Patient: Connie Nichols  Procedure(s) Performed:  ANTERIOR CERVICAL DECOMPRESSION/DISCECTOMY FUSION 2 LEVELS - Cervical four-five, five-six anterior cervical decompression fusion  Patient Location: PACU  Anesthesia Type: General  Level of Consciousness: awake  Airway and Oxygen Therapy: Patient Spontanous Breathing  Post-op Pain: mild  Post-op Assessment: Post-op Vital signs reviewed  Post-op Vital Signs: stable  Complications: No apparent anesthesia complications

## 2011-09-09 ENCOUNTER — Encounter (HOSPITAL_COMMUNITY): Payer: Self-pay | Admitting: Neurological Surgery

## 2011-09-09 MED ORDER — HYDROCODONE-ACETAMINOPHEN 5-325 MG PO TABS: 1.0000 | ORAL_TABLET | ORAL | Status: AC | PRN

## 2011-09-09 MED ORDER — DIAZEPAM 5 MG PO TABS: 5.0000 mg | ORAL_TABLET | Freq: Four times a day (QID) | ORAL | Status: AC | PRN

## 2011-09-09 NOTE — Discharge Summary (Signed)
Physician Discharge Summary  Patient ID: Connie Nichols MRN: [id number] DOB/AGE: Mar 13, 1956 56 y.o.  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 *    Discharged Condition: good  Hospital Course: Patient was admitted to undergo surgical decompression of the cervical spine at C4-5 and C5-C6 she tolerated the surgery well has had no difficulty with swallowing is ambulatory and neurologically intact at the time of discharge  Consults: none  Significant Diagnostic Studies: None  Treatments: surgery: Anterior cervical decompression C4-5 and C5-C6 arthrodesis with structural allograft anterior plate fixation [id number]  Discharge Exam: Blood pressure 131/85, pulse 74, temperature 98 F (36.7 C), temperature source Oral, resp. rate 18, SpO2 99.00%. Deltoids 5 out of 5 biceps strength 5 out of 5 triceps strength grips and intrinsics all 5 out of 5 incision is clean and dry.  Disposition: Home or Self Care  Discharge Orders    Future Orders Please Complete By Expires   Diet - low sodium heart healthy      Increase activity slowly      Discharge instructions      Comments:   No heavy lifting greater than 10 pounds. Okay to shower. Do not apply salves or ointments to incision. May resume driving when not requiring pain medication.   Call MD for:  temperature >100.4      Call MD for:  severe uncontrolled pain      Call MD for:  redness, tenderness, or signs of infection (pain, swelling, redness, odor or green/yellow discharge around incision site)        Medication List  As of 09/09/2011  9:00 AM   START taking these medications         diazepam 5 MG tablet   Commonly known as: VALIUM   Take 1 tablet (5 mg total) by mouth every 6 (six) hours as needed.      HYDROcodone-acetaminophen 5-325 MG per tablet   Commonly known as: NORCO   Take 1-2 tablets by mouth every 4 (four) hours as needed.         CONTINUE taking these medications         amLODipine 5 MG tablet   Commonly known as: NORVASC      CALCIUM 600 + D PO      cholecalciferol 1000 UNITS tablet   Commonly known as: VITAMIN D      CINNAMON PO      Fish Oil 1200 MG Caps      metFORMIN 500 MG tablet   Commonly known as: GLUCOPHAGE      multivitamins ther. w/minerals Tabs      rosuvastatin 10 MG tablet   Commonly known as: CRESTOR      sitaGLIPtin 100 MG tablet   Commonly known as: JANUVIA      valsartan 160 MG tablet   Commonly known as: DIOVAN          Where to get your medications    These are the prescriptions that you need to pick up.   You may get these medications from any pharmacy.         diazepam 5 MG tablet   HYDROcodone-acetaminophen 5-325 MG per tablet           Follow-up Information    Follow up with Grover Robinson J. Call in 3 weeks. (Call  Aram Beecham for appointment)    Contact information:   [address], [address] [place] [address] [phone]          Signed: Stefani Dama 09/09/2011, 9:00 AM

## 2012-02-05 ENCOUNTER — Other Ambulatory Visit: Payer: Self-pay | Admitting: Internal Medicine

## 2012-09-05 ENCOUNTER — Other Ambulatory Visit: Payer: Self-pay | Admitting: Obstetrics and Gynecology

## 2012-09-05 DIAGNOSIS — Z1231 Encounter for screening mammogram for malignant neoplasm of breast: Secondary | ICD-10-CM

## 2012-09-12 ENCOUNTER — Ambulatory Visit: Payer: 59

## 2012-09-16 ENCOUNTER — Other Ambulatory Visit: Payer: Self-pay | Admitting: Obstetrics and Gynecology

## 2012-09-16 DIAGNOSIS — Z1231 Encounter for screening mammogram for malignant neoplasm of breast: Secondary | ICD-10-CM

## 2012-09-20 ENCOUNTER — Encounter (HOSPITAL_COMMUNITY): Payer: Self-pay

## 2012-10-14 ENCOUNTER — Ambulatory Visit: Payer: 59

## 2012-11-07 ENCOUNTER — Ambulatory Visit
Admission: RE | Admit: 2012-11-07 | Discharge: 2012-11-07 | Disposition: A | Discharge status: Home / Self Care | Payer: BC Managed Care – PPO | Source: Ambulatory Visit | Attending: Obstetrics and Gynecology | Admitting: Obstetrics and Gynecology

## 2012-11-07 DIAGNOSIS — Z1231 Encounter for screening mammogram for malignant neoplasm of breast: Secondary | ICD-10-CM

## 2013-08-17 ENCOUNTER — Ambulatory Visit (INDEPENDENT_AMBULATORY_CARE_PROVIDER_SITE_OTHER): Payer: BC Managed Care – PPO

## 2013-08-17 ENCOUNTER — Telehealth: Payer: Self-pay | Admitting: *Deleted

## 2013-08-17 ENCOUNTER — Ambulatory Visit (INDEPENDENT_AMBULATORY_CARE_PROVIDER_SITE_OTHER): Payer: BC Managed Care – PPO | Admitting: Podiatry

## 2013-08-17 ENCOUNTER — Encounter: Payer: Self-pay | Admitting: Podiatry

## 2013-08-17 VITALS — BP 146/93 | HR 83 | Resp 16 | Ht 65.75 in | Wt 225.0 lb

## 2013-08-17 DIAGNOSIS — R52 Pain, unspecified: Secondary | ICD-10-CM

## 2013-08-17 DIAGNOSIS — M722 Plantar fascial fibromatosis: Secondary | ICD-10-CM

## 2013-08-17 MED ORDER — MELOXICAM 15 MG PO TABS: 15.0000 mg | ORAL_TABLET | Freq: Every day | ORAL | Status: AC

## 2013-08-17 MED ORDER — MELOXICAM 15 MG PO TABS: 15.0000 mg | ORAL_TABLET | Freq: Every day | ORAL | Status: DC

## 2013-08-17 NOTE — Patient Instructions (Signed)
Plantar Fasciitis (Heel Spur Syndrome) with Rehab The plantar fascia is a fibrous, ligament-like, soft-tissue structure that spans the bottom of the foot. Plantar fasciitis is a condition that causes pain in the foot due to inflammation of the tissue. SYMPTOMS   Pain and tenderness on the underneath side of the foot.  Pain that worsens with standing or walking. CAUSES  Plantar fasciitis is caused by irritation and injury to the plantar fascia on the underneath side of the foot. Common mechanisms of injury include:  Direct trauma to bottom of the foot.  Damage to a small nerve that runs under the foot where the main fascia attaches to the heel bone.  Stress placed on the plantar fascia due to bone spurs. RISK INCREASES WITH:   Activities that place stress on the plantar fascia (running, jumping, pivoting, or cutting).  Poor strength and flexibility.  Improperly fitted shoes.  Tight calf muscles.  Flat feet.  Failure to warm-up properly before activity.  Obesity. PREVENTION  Warm up and stretch properly before activity.  Allow for adequate recovery between workouts.  Maintain physical fitness:  Strength, flexibility, and endurance.  Cardiovascular fitness.  Maintain a health body weight.  Avoid stress on the plantar fascia.  Wear properly fitted shoes, including arch supports for individuals who have flat feet. PROGNOSIS  If treated properly, then the symptoms of plantar fasciitis usually resolve without surgery. However, occasionally surgery is necessary. RELATED COMPLICATIONS   Recurrent symptoms that may result in a chronic condition.  Problems of the lower back that are caused by compensating for the injury, such as limping.  Pain or weakness of the foot during push-off following surgery.  Chronic inflammation, scarring, and partial or complete fascia tear, occurring more often from repeated injections. TREATMENT  Treatment initially involves the use of  ice and medication to help reduce pain and inflammation. The use of strengthening and stretching exercises may help reduce pain with activity, especially stretches of the Achilles tendon. These exercises may be performed at home or with a therapist. Your caregiver may recommend that you use heel cups of arch supports to help reduce stress on the plantar fascia. Occasionally, corticosteroid injections are given to reduce inflammation. If symptoms persist for greater than 6 months despite non-surgical (conservative), then surgery may be recommended.  MEDICATION   If pain medication is necessary, then nonsteroidal anti-inflammatory medications, such as aspirin and ibuprofen, or other minor pain relievers, such as acetaminophen, are often recommended.  Do not take pain medication within 7 days before surgery.  Prescription pain relievers may be given if deemed necessary by your caregiver. Use only as directed and only as much as you need.  Corticosteroid injections may be given by your caregiver. These injections should be reserved for the most serious cases, because they may only be given a certain number of times. HEAT AND COLD  Cold treatment (icing) relieves pain and reduces inflammation. Cold treatment should be applied for 10 to 15 minutes every 2 to 3 hours for inflammation and pain and immediately after any activity that aggravates your symptoms. Use ice packs or massage the area with a piece of ice (ice massage).  Heat treatment may be used prior to performing the stretching and strengthening activities prescribed by your caregiver, physical therapist, or athletic trainer. Use a heat pack or soak the injury in warm water. SEEK IMMEDIATE MEDICAL CARE IF:  Treatment seems to offer no benefit, or the condition worsens.  Any medications produce adverse side effects. EXERCISES RANGE   OF MOTION (ROM) AND STRETCHING EXERCISES - Plantar Fasciitis (Heel Spur Syndrome) These exercises may help you  when beginning to rehabilitate your injury. Your symptoms may resolve with or without further involvement from your physician, physical therapist or athletic trainer. While completing these exercises, remember:   Restoring tissue flexibility helps normal motion to return to the joints. This allows healthier, less painful movement and activity.  An effective stretch should be held for at least 30 seconds.  A stretch should never be painful. You should only feel a gentle lengthening or release in the stretched tissue. RANGE OF MOTION - Toe Extension, Flexion  Sit with your right / left leg crossed over your opposite knee.  Grasp your toes and gently pull them back toward the top of your foot. You should feel a stretch on the bottom of your toes and/or foot.  Hold this stretch for __________ seconds.  Now, gently pull your toes toward the bottom of your foot. You should feel a stretch on the top of your toes and or foot.  Hold this stretch for __________ seconds. Repeat __________ times. Complete this stretch __________ times per day.  RANGE OF MOTION - Ankle Dorsiflexion, Active Assisted  Remove shoes and sit on a chair that is preferably not on a carpeted surface.  Place right / left foot under knee. Extend your opposite leg for support.  Keeping your heel down, slide your right / left foot back toward the chair until you feel a stretch at your ankle or calf. If you do not feel a stretch, slide your bottom forward to the edge of the chair, while still keeping your heel down.  Hold this stretch for __________ seconds. Repeat __________ times. Complete this stretch __________ times per day.  STRETCH  Gastroc, Standing  Place hands on wall.  Extend right / left leg, keeping the front knee somewhat bent.  Slightly point your toes inward on your back foot.  Keeping your right / left heel on the floor and your knee straight, shift your weight toward the wall, not allowing your back to  arch.  You should feel a gentle stretch in the right / left calf. Hold this position for __________ seconds. Repeat __________ times. Complete this stretch __________ times per day. STRETCH  Soleus, Standing  Place hands on wall.  Extend right / left leg, keeping the other knee somewhat bent.  Slightly point your toes inward on your back foot.  Keep your right / left heel on the floor, bend your back knee, and slightly shift your weight over the back leg so that you feel a gentle stretch deep in your back calf.  Hold this position for __________ seconds. Repeat __________ times. Complete this stretch __________ times per day. STRETCH  Gastrocsoleus, Standing  Note: This exercise can place a lot of stress on your foot and ankle. Please complete this exercise only if specifically instructed by your caregiver.   Place the ball of your right / left foot on a step, keeping your other foot firmly on the same step.  Hold on to the wall or a rail for balance.  Slowly lift your other foot, allowing your body weight to press your heel down over the edge of the step.  You should feel a stretch in your right / left calf.  Hold this position for __________ seconds.  Repeat this exercise with a slight bend in your right / left knee. Repeat __________ times. Complete this stretch __________ times per day.    STRENGTHENING EXERCISES - Plantar Fasciitis (Heel Spur Syndrome)  These exercises may help you when beginning to rehabilitate your injury. They may resolve your symptoms with or without further involvement from your physician, physical therapist or athletic trainer. While completing these exercises, remember:   Muscles can gain both the endurance and the strength needed for everyday activities through controlled exercises.  Complete these exercises as instructed by your physician, physical therapist or athletic trainer. Progress the resistance and repetitions only as guided. STRENGTH - Towel  Curls  Sit in a chair positioned on a non-carpeted surface.  Place your foot on a towel, keeping your heel on the floor.  Pull the towel toward your heel by only curling your toes. Keep your heel on the floor.  If instructed by your physician, physical therapist or athletic trainer, add ____________________ at the end of the towel. Repeat __________ times. Complete this exercise __________ times per day. STRENGTH - Ankle Inversion  Secure one end of a rubber exercise band/tubing to a fixed object (table, pole). Loop the other end around your foot just before your toes.  Place your fists between your knees. This will focus your strengthening at your ankle.  Slowly, pull your big toe up and in, making sure the band/tubing is positioned to resist the entire motion.  Hold this position for __________ seconds.  Have your muscles resist the band/tubing as it slowly pulls your foot back to the starting position. Repeat __________ times. Complete this exercises __________ times per day.  Document Released: 08/17/2005 Document Revised: 11/09/2011 Document Reviewed: 11/29/2008 [employer] Patient Information 2014 [employer]. Plantar Fasciitis Plantar fasciitis is a common condition that causes foot pain. It is soreness (inflammation) of the band of tough fibrous tissue on the bottom of the foot that runs from the heel bone (calcaneus) to the ball of the foot. The cause of this soreness may be from excessive standing, poor fitting shoes, running on hard surfaces, being overweight, having an abnormal walk, or overuse (this is common in runners) of the painful foot or feet. It is also common in aerobic exercise dancers and ballet dancers. SYMPTOMS  Most people with plantar fasciitis complain of:  Severe pain in the morning on the bottom of their foot especially when taking the first steps out of bed. This pain recedes after a few minutes of walking.  Severe pain is experienced also during walking  following a long period of inactivity.  Pain is worse when walking barefoot or up stairs DIAGNOSIS   Your caregiver will diagnose this condition by examining and feeling your foot.  Special tests such as X-rays of your foot, are usually not needed. PREVENTION   Consult a sports medicine professional before beginning a new exercise program.  Walking programs offer a good workout. With walking there is a lower chance of overuse injuries common to runners. There is less impact and less jarring of the joints.  Begin all new exercise programs slowly. If problems or pain develop, decrease the amount of time or distance until you are at a comfortable level.  Wear good shoes and replace them regularly.  Stretch your foot and the heel cords at the back of the ankle (Achilles tendon) both before and after exercise.  Run or exercise on even surfaces that are not hard. For example, asphalt is better than pavement.  Do not run barefoot on hard surfaces.  If using a treadmill, vary the incline.  Do not continue to workout if you have foot or joint   problems. Seek professional help if they do not improve. HOME CARE INSTRUCTIONS   Avoid activities that cause you pain until you recover.  Use ice or cold packs on the problem or painful areas after working out.  Only take over-the-counter or prescription medicines for pain, discomfort, or fever as directed by your caregiver.  Soft shoe inserts or athletic shoes with air or gel sole cushions may be helpful.  If problems continue or become more severe, consult a sports medicine caregiver or your own health care provider. Cortisone is a potent anti-inflammatory medication that may be injected into the painful area. You can discuss this treatment with your caregiver. MAKE SURE YOU:   Understand these instructions.  Will watch your condition.  Will get help right away if you are not doing well or get worse. Document Released: 05/12/2001 Document  Revised: 11/09/2011 Document Reviewed: 07/11/2008 [employer] Patient Information 2014 [employer].  

## 2013-08-17 NOTE — Progress Notes (Signed)
   Subjective:    Patient ID: Connie Nichols, female    DOB: Sep 06, 1955, 57 y.o.   MRN: [id number] "[facility] said I have a spur on my left foot."  Foot Pain This is a new (ankle pain left) problem. Episode onset: 4 months. The problem occurs intermittently. The problem has been gradually worsening. Exacerbated by: wear high heels. She has tried NSAIDs (wear flat shoes, take advil, Curamin) for the symptoms. The treatment provided no relief.      Review of Systems  Allergic/Immunologic: Positive for environmental allergies.  All other systems reviewed and are negative.       Objective:   Physical Exam: I have reviewed her past medical history medications allergies surgeries social history. Pulses are palpable bilateral +2/4 DP and PT bilateral. Capillary fill time to digits one through 5 bilateral is immediate. Neurologic sensorium is slightly diminished per since once the monofilament bilateral. Deep tendon reflexes are brisk and symmetrical. Muscle strength is 5 over 5 dorsiflexors plantar flexors inverters everters all intrinsic musculature is intact. Orthopedic evaluation Mr. is all joints distal to the ankle a full range of motion without crepitus. She has pain on palpation medial continued tubercle of the left heel. Radiographic evaluation does demonstrate a plantar distally oriented calcaneal heel spur with soft tissue increase in density at the plantar fascial calcaneal insertion site. She also has area of calcification beneath the calcaneus. Cutaneous evaluation demonstrates supple well hydrated cutis no erythema edema saline is drainage or odor.        Assessment & Plan:  Assessment: Plantar fasciitis left.  Plan: We discussed the etiology pathology conservative versus surgical therapies. Injected the left heel today. Put her in a plantar fascial strapping. Started her on Mobic 15 mg 1 by mouth daily. Discussed appropriate shoe gear stretching exercises ice  therapy.

## 2013-08-17 NOTE — Telephone Encounter (Signed)
Pt states that She uses [facility] and not [facility].  I switched pharmacy to [facility].

## 2013-09-19 ENCOUNTER — Encounter: Payer: Self-pay | Admitting: Podiatry

## 2013-09-19 ENCOUNTER — Ambulatory Visit (INDEPENDENT_AMBULATORY_CARE_PROVIDER_SITE_OTHER): Payer: Medicare Other | Admitting: Podiatry

## 2013-09-19 VITALS — BP 111/72 | HR 80 | Resp 16

## 2013-09-19 DIAGNOSIS — M722 Plantar fascial fibromatosis: Secondary | ICD-10-CM

## 2013-09-19 NOTE — Progress Notes (Signed)
The heel is doing much better , its about 80% better . Ankle seems to be good as well. She states that she stop taking her anti-inflammatory.  Objective: Vitals are stable she is alert and oriented x3. Pulses are palpable left. Minimal pain on palpation medial continued tubercle of the left heel.  Assessment: Well-healing plantar fasciitis.  Plan: Continue all conservative therapies including nonsteroidal anti-inflammatory drugs, night splint, icing, exercise, and shoe gear. I will followup with her in one month to 6 weeks if necessary. If she returns site an injection will be necessary her orthotics or both.

## 2013-10-17 ENCOUNTER — Ambulatory Visit: Payer: Medicare Other | Admitting: Podiatry

## 2013-12-18 ENCOUNTER — Other Ambulatory Visit: Payer: Self-pay

## 2013-12-18 DIAGNOSIS — Z1231 Encounter for screening mammogram for malignant neoplasm of breast: Secondary | ICD-10-CM

## 2013-12-25 ENCOUNTER — Ambulatory Visit
Admission: RE | Admit: 2013-12-25 | Discharge: 2013-12-25 | Disposition: A | Discharge status: Home / Self Care | Payer: Medicare Other | Source: Ambulatory Visit

## 2013-12-25 ENCOUNTER — Encounter (INDEPENDENT_AMBULATORY_CARE_PROVIDER_SITE_OTHER): Payer: Self-pay

## 2013-12-25 DIAGNOSIS — Z1231 Encounter for screening mammogram for malignant neoplasm of breast: Secondary | ICD-10-CM

## 2014-12-11 ENCOUNTER — Other Ambulatory Visit: Payer: Self-pay

## 2014-12-11 DIAGNOSIS — Z1231 Encounter for screening mammogram for malignant neoplasm of breast: Secondary | ICD-10-CM

## 2015-01-01 ENCOUNTER — Other Ambulatory Visit: Payer: Self-pay | Admitting: Internal Medicine

## 2015-01-01 ENCOUNTER — Ambulatory Visit
Admission: RE | Admit: 2015-01-01 | Discharge: 2015-01-01 | Disposition: A | Discharge status: Home / Self Care | Payer: Medicare HMO | Source: Ambulatory Visit | Attending: Internal Medicine | Admitting: Internal Medicine

## 2015-01-01 ENCOUNTER — Ambulatory Visit
Admission: RE | Admit: 2015-01-01 | Discharge: 2015-01-01 | Disposition: A | Discharge status: Home / Self Care | Payer: Medicare HMO | Source: Ambulatory Visit

## 2015-01-01 DIAGNOSIS — M545 Low back pain: Secondary | ICD-10-CM

## 2015-01-01 DIAGNOSIS — Z1231 Encounter for screening mammogram for malignant neoplasm of breast: Secondary | ICD-10-CM

## 2015-08-19 IMAGING — CR DG LUMBAR SPINE COMPLETE 4+V
5 series · 5 of 5 positions shown · non-contrast
Comparison: None.

CLINICAL DATA: Chronic lumbar spine pain with increasing intensity
without known injury or surgery.

EXAM:
LUMBAR SPINE - COMPLETE 4+ VIEW

[t l-spine a.p.]
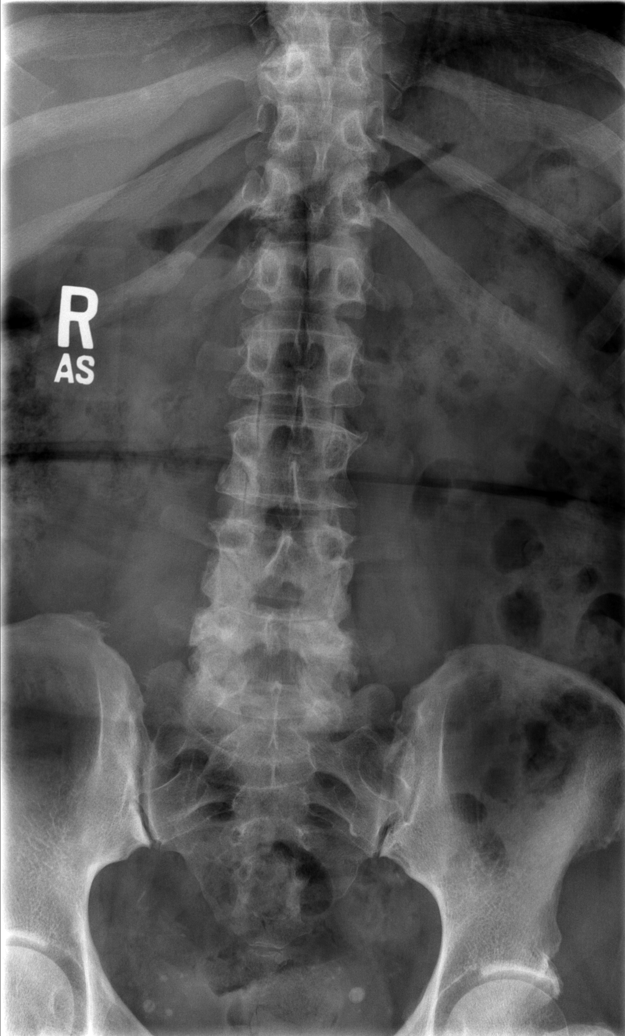

[t l-spine oblique exposure (1 of 2)]
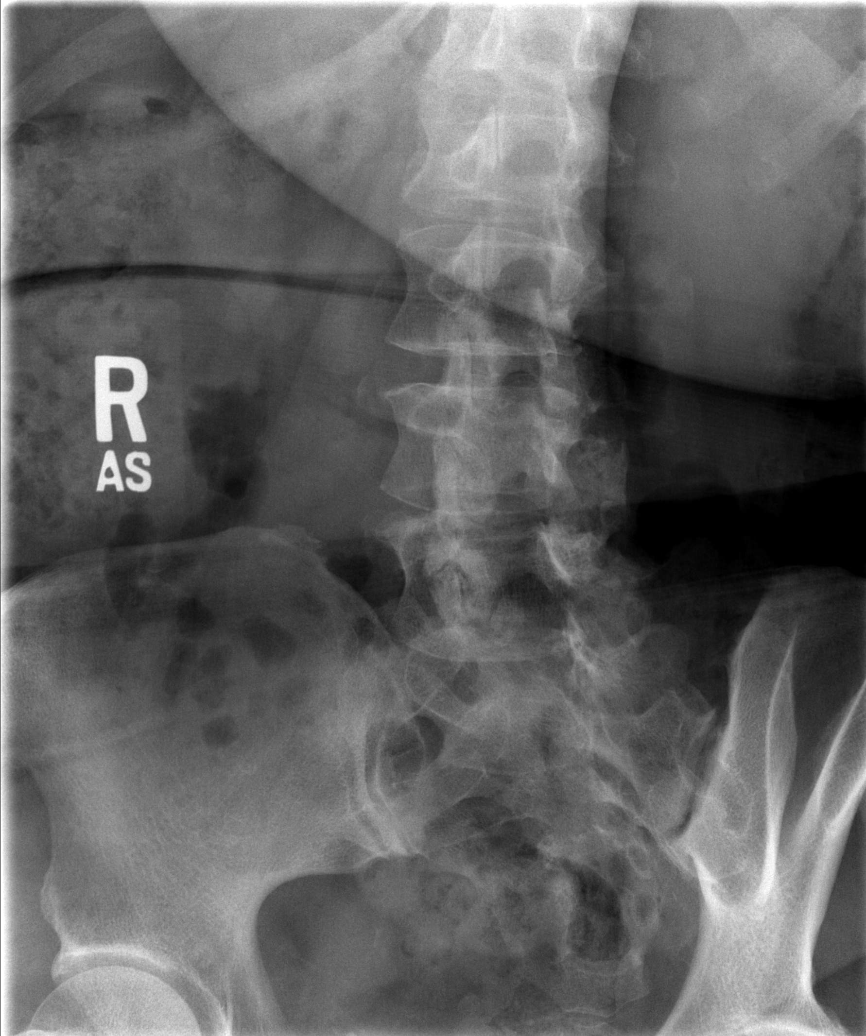

[t l-spine oblique exposure (2 of 2)]
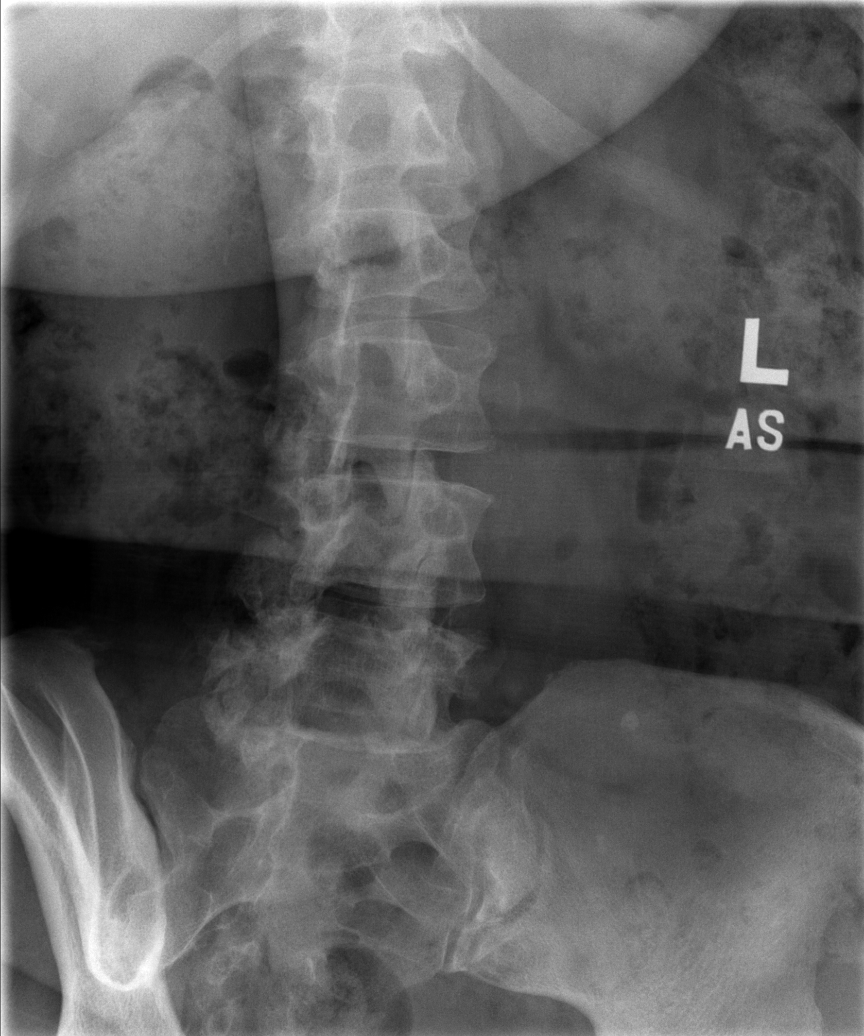

[t l-spine lat]
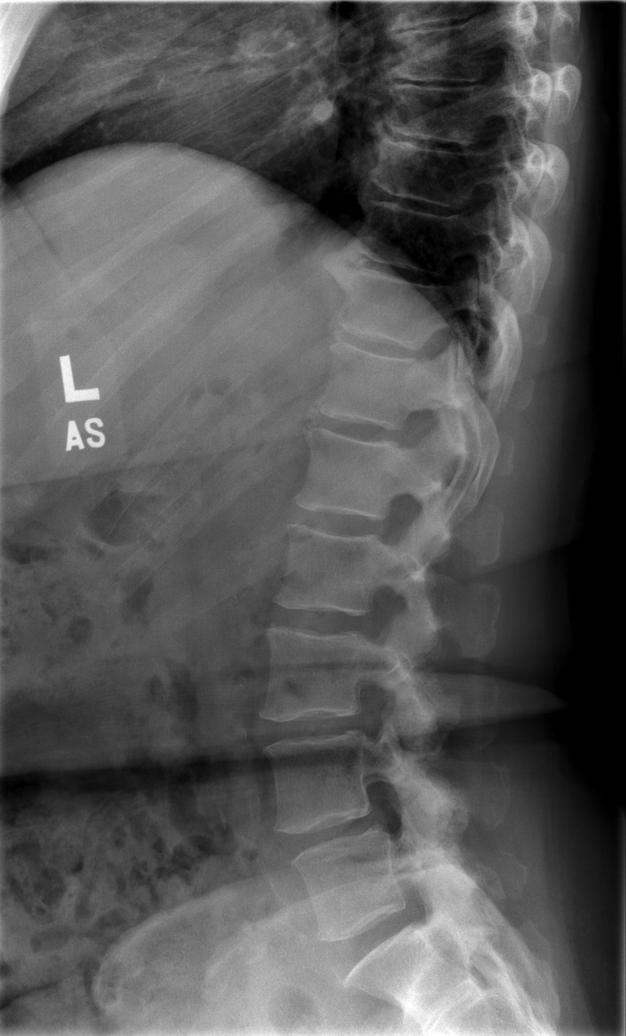

[t l-spine l5-s1 spot]
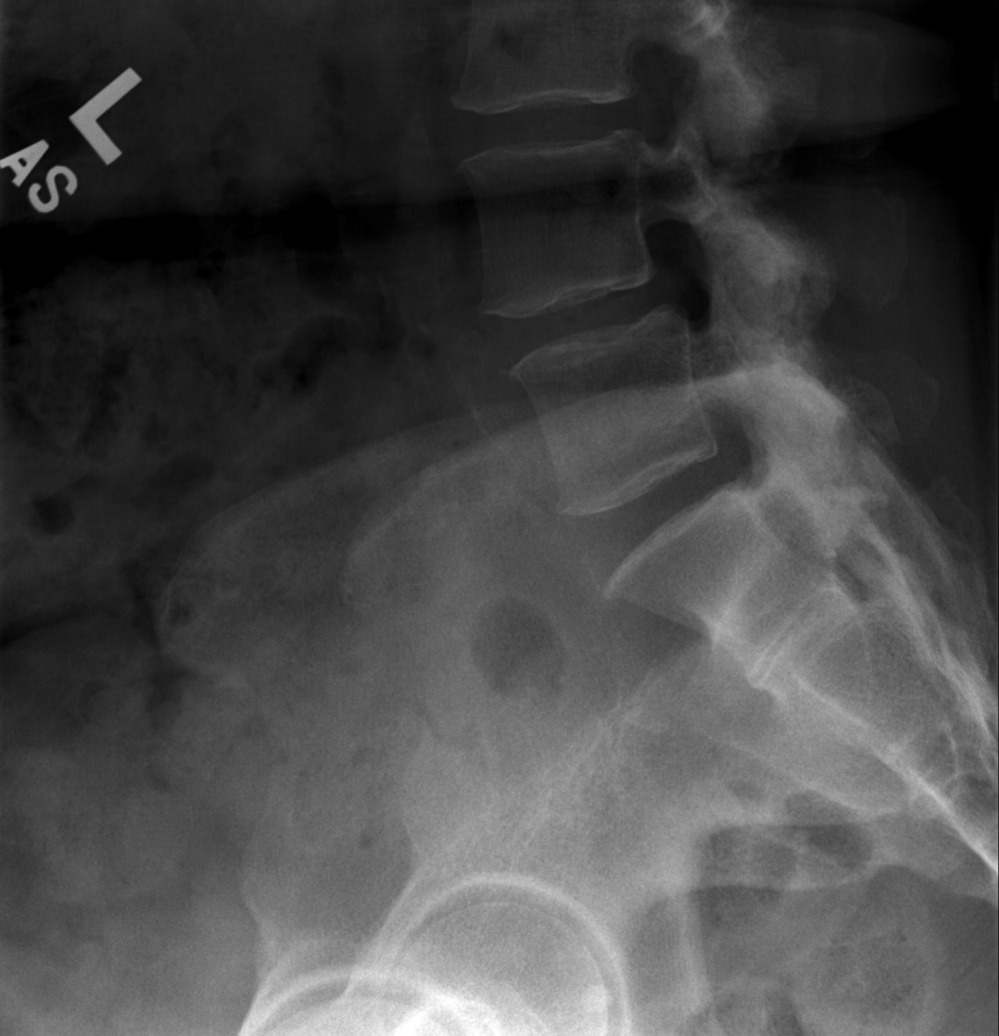

[5 of 5 positions shown; findings below may reference images not displayed]

FINDINGS: The lumbar vertebral bodies are preserved in height. The pedicles
and transverse processes are intact. There is mild disc space
narrowing at L4-5 and minimally at L3-4. There is no
spondylolisthesis. There is facet joint hypertrophy from L3
inferiorly.
IMPRESSION: There is degenerative disc space narrowing at L4-5 and to a lesser
extent at L3-4. There is facet joint hypertrophy at these levels as
well. There is no acute bony abnormality.

## 2015-12-31 ENCOUNTER — Other Ambulatory Visit: Payer: Self-pay

## 2015-12-31 DIAGNOSIS — Z1231 Encounter for screening mammogram for malignant neoplasm of breast: Secondary | ICD-10-CM

## 2016-01-15 ENCOUNTER — Ambulatory Visit
Admission: RE | Admit: 2016-01-15 | Discharge: 2016-01-15 | Disposition: A | Discharge status: Home / Self Care | Payer: Medicare HMO | Source: Ambulatory Visit

## 2016-01-15 DIAGNOSIS — Z1231 Encounter for screening mammogram for malignant neoplasm of breast: Secondary | ICD-10-CM

## 2016-03-18 ENCOUNTER — Emergency Department (HOSPITAL_COMMUNITY)
Admission: EM | Admit: 2016-03-18 | Discharge: 2016-03-19 | Disposition: A | Discharge status: Home / Self Care | Payer: Medicare HMO | Attending: Emergency Medicine | Admitting: Emergency Medicine

## 2016-03-18 ENCOUNTER — Encounter (HOSPITAL_COMMUNITY): Payer: Self-pay

## 2016-03-18 DIAGNOSIS — M1990 Unspecified osteoarthritis, unspecified site: Secondary | ICD-10-CM | POA: Insufficient documentation

## 2016-03-18 DIAGNOSIS — Z79899 Other long term (current) drug therapy: Secondary | ICD-10-CM | POA: Insufficient documentation

## 2016-03-18 DIAGNOSIS — E119 Type 2 diabetes mellitus without complications: Secondary | ICD-10-CM | POA: Diagnosis not present

## 2016-03-18 DIAGNOSIS — Z7984 Long term (current) use of oral hypoglycemic drugs: Secondary | ICD-10-CM | POA: Insufficient documentation

## 2016-03-18 DIAGNOSIS — Z87891 Personal history of nicotine dependence: Secondary | ICD-10-CM | POA: Diagnosis not present

## 2016-03-18 DIAGNOSIS — I10 Essential (primary) hypertension: Secondary | ICD-10-CM | POA: Diagnosis present

## 2016-03-18 DIAGNOSIS — E669 Obesity, unspecified: Secondary | ICD-10-CM | POA: Insufficient documentation

## 2016-03-18 DIAGNOSIS — Z6836 Body mass index (BMI) 36.0-36.9, adult: Secondary | ICD-10-CM | POA: Insufficient documentation

## 2016-03-18 DIAGNOSIS — F329 Major depressive disorder, single episode, unspecified: Secondary | ICD-10-CM | POA: Diagnosis not present

## 2016-03-18 LAB — CBG MONITORING, ED: GLUCOSE-CAPILLARY: 128 mg/dL — AB (ref 65–99)

## 2016-03-18 NOTE — ED Notes (Signed)
Pt complains of a headache today and at home her blood pressure was in the 200's, she says she had a stressful day yesterday

## 2016-03-19 NOTE — Discharge Instructions (Signed)
DASH Eating Plan °DASH stands for "Dietary Approaches to Stop Hypertension." The DASH eating plan is a healthy eating plan that has been shown to reduce high blood pressure (hypertension). Additional health benefits may include reducing the risk of type 2 diabetes mellitus, heart disease, and stroke. The DASH eating plan may also help with weight loss. °WHAT DO I NEED TO KNOW ABOUT THE DASH EATING PLAN? °For the DASH eating plan, you will follow these general guidelines: °· Choose foods with a percent daily value for sodium of less than 5% (as listed on the food label). °· Use salt-free seasonings or herbs instead of table salt or sea salt. °· Check with your health care provider or pharmacist before using salt substitutes. °· Eat lower-sodium products, often labeled as "lower sodium" or "no salt added." °· Eat fresh foods. °· Eat more vegetables, fruits, and low-fat dairy products. °· Choose whole grains. Look for the word "whole" as the first word in the ingredient list. °· Choose fish and skinless chicken or turkey more often than red meat. Limit fish, poultry, and meat to 6 oz (170 g) each day. °· Limit sweets, desserts, sugars, and sugary drinks. °· Choose heart-healthy fats. °· Limit cheese to 1 oz (28 g) per day. °· Eat more home-cooked food and less restaurant, buffet, and fast food. °· Limit fried foods. °· Cook foods using methods other than frying. °· Limit canned vegetables. If you do use them, rinse them well to decrease the sodium. °· When eating at a restaurant, ask that your food be prepared with less salt, or no salt if possible. °WHAT FOODS CAN I EAT? °Seek help from a dietitian for individual calorie needs. °Grains °Whole grain or whole wheat bread. Brown rice. Whole grain or whole wheat pasta. Quinoa, bulgur, and whole grain cereals. Low-sodium cereals. Corn or whole wheat flour tortillas. Whole grain cornbread. Whole grain crackers. Low-sodium crackers. °Vegetables °Fresh or frozen vegetables  (raw, steamed, roasted, or grilled). Low-sodium or reduced-sodium tomato and vegetable juices. Low-sodium or reduced-sodium tomato sauce and paste. Low-sodium or reduced-sodium canned vegetables.  °Fruits °All fresh, canned (in natural juice), or frozen fruits. °Meat and Other Protein Products °Ground beef (85% or leaner), grass-fed beef, or beef trimmed of fat. Skinless chicken or turkey. Ground chicken or turkey. Pork trimmed of fat. All fish and seafood. Eggs. Dried beans, peas, or lentils. Unsalted nuts and seeds. Unsalted canned beans. °Dairy °Low-fat dairy products, such as skim or 1% milk, 2% or reduced-fat cheeses, low-fat ricotta or cottage cheese, or plain low-fat yogurt. Low-sodium or reduced-sodium cheeses. °Fats and Oils °Tub margarines without trans fats. Light or reduced-fat mayonnaise and salad dressings (reduced sodium). Avocado. Safflower, olive, or canola oils. Natural peanut or almond butter. °Other °Unsalted popcorn and pretzels. °The items listed above may not be a complete list of recommended foods or beverages. Contact your dietitian for more options. °WHAT FOODS ARE NOT RECOMMENDED? °Grains °White bread. White pasta. White rice. Refined cornbread. Bagels and croissants. Crackers that contain trans fat. °Vegetables °Creamed or fried vegetables. Vegetables in a cheese sauce. Regular canned vegetables. Regular canned tomato sauce and paste. Regular tomato and vegetable juices. °Fruits °Dried fruits. Canned fruit in light or heavy syrup. Fruit juice. °Meat and Other Protein Products °Fatty cuts of meat. Ribs, chicken wings, bacon, sausage, bologna, salami, chitterlings, fatback, hot dogs, bratwurst, and packaged luncheon meats. Salted nuts and seeds. Canned beans with salt. °Dairy °Whole or 2% milk, cream, half-and-half, and cream cheese. Whole-fat or sweetened yogurt. Full-fat   cheeses or blue cheese. Nondairy creamers and whipped toppings. Processed cheese, cheese spreads, or cheese  curds. °Condiments °Onion and garlic salt, seasoned salt, table salt, and sea salt. Canned and packaged gravies. Worcestershire sauce. Tartar sauce. Barbecue sauce. Teriyaki sauce. Soy sauce, including reduced sodium. Steak sauce. Fish sauce. Oyster sauce. Cocktail sauce. Horseradish. Ketchup and mustard. Meat flavorings and tenderizers. Bouillon cubes. Hot sauce. Tabasco sauce. Marinades. Taco seasonings. Relishes. °Fats and Oils °Butter, stick margarine, lard, shortening, ghee, and bacon fat. Coconut, palm kernel, or palm oils. Regular salad dressings. °Other °Pickles and olives. Salted popcorn and pretzels. °The items listed above may not be a complete list of foods and beverages to avoid. Contact your dietitian for more information. °WHERE CAN I FIND MORE INFORMATION? °National Heart, [employer], [employer] Institute: www.nhlbi.nih.gov/health/health-topics/topics/dash/ °  °This information is not intended to replace advice given to you by your health care provider. Make sure you discuss any questions you have with your health care provider. °  °Document Released: 08/06/2011 Document Revised: 09/07/2014 Document Reviewed: 06/21/2013 °Elsevier Interactive Patient Education ©2016 Elsevier Inc. ° °Hypertension °Hypertension, commonly called high blood pressure, is when the force of blood pumping through your arteries is too strong. Your arteries are the blood vessels that carry blood from your heart throughout your body. A blood pressure reading consists of a higher number over a lower number, such as 110/72. The higher number (systolic) is the pressure inside your arteries when your heart pumps. The lower number (diastolic) is the pressure inside your arteries when your heart relaxes. Ideally you want your blood pressure below 120/80. °Hypertension forces your heart to work harder to pump blood. Your arteries may become narrow or stiff. Having untreated or uncontrolled hypertension can cause heart attack, stroke, kidney  disease, and other problems. °RISK FACTORS °Some risk factors for high blood pressure are controllable. Others are not.  °Risk factors you cannot control include:  °· Race. You may be at higher risk if you are African American. °· Age. Risk increases with age. °· Gender. Men are at higher risk than women before age 45 years. After age 65, women are at higher risk than men. °Risk factors you can control include: °· Not getting enough exercise or physical activity. °· Being overweight. °· Getting too much fat, sugar, calories, or salt in your diet. °· Drinking too much alcohol. °SIGNS AND SYMPTOMS °Hypertension does not usually cause signs or symptoms. Extremely high blood pressure (hypertensive crisis) may cause headache, anxiety, shortness of breath, and nosebleed. °DIAGNOSIS °To check if you have hypertension, your health care provider will measure your blood pressure while you are seated, with your arm held at the level of your heart. It should be measured at least twice using the same arm. Certain conditions can cause a difference in blood pressure between your right and left arms. A blood pressure reading that is higher than normal on one occasion does not mean that you need treatment. If it is not clear whether you have high blood pressure, you may be asked to return on a different day to have your blood pressure checked again. Or, you may be asked to monitor your blood pressure at home for 1 or more weeks. °TREATMENT °Treating high blood pressure includes making lifestyle changes and possibly taking medicine. Living a healthy lifestyle can help lower high blood pressure. You may need to change some of your habits. °Lifestyle changes may include: °· Following the DASH diet. This diet is high in fruits, vegetables, and whole   grains. It is low in salt, red meat, and added sugars. °· Keep your sodium intake below 2,300 mg per day. °· Getting at least 30-45 minutes of aerobic exercise at least 4 times per  week. °· Losing weight if necessary. °· Not smoking. °· Limiting alcoholic beverages. °· Learning ways to reduce stress. °Your health care provider may prescribe medicine if lifestyle changes are not enough to get your blood pressure under control, and if one of the following is true: °· You are 18-59 years of age and your systolic blood pressure is above 140. °· You are 60 years of age or older, and your systolic blood pressure is above 150. °· Your diastolic blood pressure is above 90. °· You have diabetes, and your systolic blood pressure is over 140 or your diastolic blood pressure is over 90. °· You have kidney disease and your blood pressure is above 140/90. °· You have heart disease and your blood pressure is above 140/90. °Your personal target blood pressure may vary depending on your medical conditions, your age, and other factors. °HOME CARE INSTRUCTIONS °· Have your blood pressure rechecked as directed by your health care provider.   °· Take medicines only as directed by your health care provider. Follow the directions carefully. Blood pressure medicines must be taken as prescribed. The medicine does not work as well when you skip doses. Skipping doses also puts you at risk for problems. °· Do not smoke.   °· Monitor your blood pressure at home as directed by your health care provider.  °SEEK MEDICAL CARE IF:  °· You think you are having a reaction to medicines taken. °· You have recurrent headaches or feel dizzy. °· You have swelling in your ankles. °· You have trouble with your vision. °SEEK IMMEDIATE MEDICAL CARE IF: °· You develop a severe headache or confusion. °· You have unusual weakness, numbness, or feel faint. °· You have severe chest or abdominal pain. °· You vomit repeatedly. °· You have trouble breathing. °MAKE SURE YOU:  °· Understand these instructions. °· Will watch your condition. °· Will get help right away if you are not doing well or get worse. °  °This information is not intended to  replace advice given to you by your health care provider. Make sure you discuss any questions you have with your health care provider. °  °Document Released: 08/17/2005 Document Revised: 01/01/2015 Document Reviewed: 06/09/2013 °Elsevier Interactive Patient Education ©2016 Elsevier Inc. ° °

## 2016-03-19 NOTE — ED Provider Notes (Signed)
CSN: [id number]     Arrival date & time 03/18/16  2207 History  By signing my name below, I, Reola Mosher, attest that this documentation has been prepared under the direction and in the presence of Sharlett Iles, MD. Electronically Signed: Reola Mosher, ED Scribe. 03/19/2016. 1:07 AM.   Chief Complaint  Patient presents with  . Hypertension   The history is provided by the patient. No language interpreter was used.   HPI Comments: Connie Nichols is a 60 y.o. female with a PMHx of HTN, heart murmur, DM who presents to the Emergency Department complaining of gradual onset, waxing and waning, constant, dull headache associated with elevated BP onset PTA. She also notes that she had a similar headache yesterday, but it had went away prior to today. She states that she took her BP at home today and it was in the 200s, prompting her to come to the ED. Pt currently takes HCTZ for her HTN, and is compliant. No recent medication changes. Pt notes that she was seen by her PCP recently.  She states that her PCP did not want to change her HTN medications at that point. Denies weakness, numbness, leg swelling, vision changes, CP, SOB, or any other symptoms. No sudden onset of thunderclap or severe headache.  Past Medical History  Diagnosis Date  . Complication of anesthesia   . PONV (postoperative nausea and vomiting)     PT STATES "MORPHINE MAKES ME SICK AS A DOG.   . Hypertension   . Heart murmur     NEVER SEEN A CARDIOLOGIST.  STRESS YEARS AGO IN [place] ... NORMAL .   Marland Kitchen Diabetes mellitus     ONLY  ORAL MEDS  . Neuromuscular disorder ([place])     DUE TO NECK PAIN AND RADIATING TO  RIGHT SHOULDER  . Arthritis   . Depression   . Anxiety   . Otitis externa of left ear     residual changes most consistent with cerum  . Sinus disease     mild, occult   . Obesity, mild    Past Surgical History  Procedure Laterality Date  . Abdominal hysterectomy    . Tubal ligation     . Dilation and curettage of uterus    . Anterior cervical decomp/discectomy fusion  09/08/2011    Procedure: ANTERIOR CERVICAL DECOMPRESSION/DISCECTOMY FUSION 2 LEVELS;  Surgeon: Earleen Newport;  Location: [place] [facility];  Service: Neurosurgery;  Laterality: N/A;  Cervical four-five, five-six anterior cervical decompression fusion   Family History  Problem Relation Age of Onset  . Diabetes Mellitus II Mother   . Hypertension Mother   . Hypertension Father   . Diabetes Mellitus II Father   . Heart disease Sister    Social History  Substance Use Topics  . Smoking status: Former Research scientist (life sciences)  . Smokeless tobacco: Never Used  . Alcohol Use: Yes     Comment: occasionally   OB History    No data available     Review of Systems A complete 10 system review of systems was obtained and all systems are negative except as noted in the HPI and PMH.   Allergies  Aspirin; Morphine and related; and Penicillins cross reactors  Home Medications   Prior to Admission medications   Medication Sig Start Date End Date Taking? Authorizing Provider  atorvastatin (LIPITOR) 20 MG tablet Take 1 tablet by mouth daily. 02/10/16  Yes Historical Provider, MD  CINNAMON PO Take 1 tablet by  mouth daily.     Yes Historical Provider, MD  metFORMIN (GLUCOPHAGE) 850 MG tablet Take 1 tablet by mouth 2 (two) times daily. 03/06/16  Yes Historical Provider, MD  valsartan-hydrochlorothiazide (DIOVAN-HCT) 160-12.5 MG tablet Take 1 tablet by mouth daily. 02/05/16  Yes Historical Provider, MD  VICTOZA 18 MG/3ML SOPN 0.6 mg daily. 02/07/16  Yes Historical Provider, MD  meloxicam (MOBIC) 15 MG tablet Take 1 tablet (15 mg total) by mouth daily. Patient not taking: Reported on 03/18/2016 08/17/13   Max T Hyatt, DPM   BP 161/92 mmHg  Pulse 78  Temp(Src) 98.2 F (36.8 C) (Oral)  Resp 15  Ht 5\' 5"  (1.651 m)  Wt 218 lb (98.884 kg)  BMI 36.28 kg/m2  SpO2 98%   Physical Exam  Constitutional: She is oriented to person, place, and time.  She appears well-developed and well-nourished. No distress.  HENT:  Head: Normocephalic and atraumatic.  Moist mucous membranes  Eyes: Conjunctivae are normal. Pupils are equal, round, and reactive to light.  Neck: Neck supple.  Cardiovascular: Normal rate, regular rhythm and normal heart sounds.   No murmur heard. Pulmonary/Chest: Effort normal and breath sounds normal.  Abdominal: Soft. Bowel sounds are normal. She exhibits no distension. There is no tenderness.  Musculoskeletal: She exhibits no edema.  Neurological: She is alert and oriented to person, place, and time. She exhibits normal muscle tone. Coordination normal.  Fluent speech  Skin: Skin is warm and dry.  Psychiatric: She has a normal mood and affect. Judgment normal.  Nursing note and vitals reviewed.  ED Course  Procedures   DIAGNOSTIC STUDIES: Oxygen Saturation is 98% on RA, normal by my interpretation.   COORDINATION OF CARE: 1:06 AM-Discussed next steps with pt. Pt verbalized understanding and is agreeable with the plan.   Labs Review Labs Reviewed  CBG MONITORING, ED - Abnormal; Notable for the following:    Glucose-Capillary 128 (*)    All other components within normal limits   I have personally reviewed and evaluated these lab results as part of my medical decision-making.   EKG Interpretation   Date/Time:  Thursday March 19 2016 01:32:34 EDT Ventricular Rate:  66 PR Interval:    QRS Duration: 97 QT Interval:  426 QTC Calculation: 447 R Axis:   34 Text Interpretation:  Sinus rhythm Borderline prolonged PR interval No  significant change since last tracing Confirmed by LITTLE MD, RACHEL  [id number]) on 03/19/2016 1:49:48 AM      MDM   Final diagnoses:  Essential hypertension    Patient presents for evaluation of hypertension that she noted at home when she had a headache and checked her blood pressure. She was well-appearing on exam. Vital signs notable for initial BP 161/92. Her systolic blood  pressure was 147 during my examination. Normal exam and patient denied any associated chest pain, shortness of breath, or current headache. EKG unchanged from previous. Given her well appearance, no other associated complaints, I do not feel she needs any further workup or acute blood pressure management at this time. No signs or symptoms to suggest hypertensive emergency. Discussed follow-up plan with PCP and low-salt diet. Return precautions reviewed. Patient voiced understanding and was discharged in satisfactory condition.  I personally performed the services described in this documentation, which was scribed in my presence. The recorded information has been reviewed and is accurate.    Sharlett Iles, MD 03/19/16 [phone]

## 2016-06-15 ENCOUNTER — Encounter (HOSPITAL_COMMUNITY): Payer: Self-pay | Admitting: *Deleted

## 2016-06-16 ENCOUNTER — Encounter (HOSPITAL_COMMUNITY): Payer: Self-pay | Admitting: *Deleted

## 2016-06-19 ENCOUNTER — Ambulatory Visit (HOSPITAL_COMMUNITY): Admission: RE | Admit: 2016-06-19 | Payer: Medicare HMO | Source: Ambulatory Visit | Admitting: Gastroenterology

## 2016-06-19 SURGERY — COLONOSCOPY WITH PROPOFOL
Anesthesia: Monitor Anesthesia Care

## 2016-06-30 ENCOUNTER — Encounter (HOSPITAL_COMMUNITY): Payer: Self-pay | Admitting: *Deleted

## 2016-07-03 ENCOUNTER — Encounter (HOSPITAL_COMMUNITY): Payer: Self-pay

## 2016-07-03 ENCOUNTER — Encounter (HOSPITAL_COMMUNITY)
Admission: RE | Disposition: A | Discharge status: Home / Self Care | Payer: Self-pay | Source: Ambulatory Visit | Attending: Gastroenterology

## 2016-07-03 ENCOUNTER — Ambulatory Visit (HOSPITAL_COMMUNITY): Payer: Medicare HMO | Admitting: Anesthesiology

## 2016-07-03 ENCOUNTER — Ambulatory Visit (HOSPITAL_COMMUNITY)
Admission: RE | Admit: 2016-07-03 | Discharge: 2016-07-03 | Disposition: A | Discharge status: Home / Self Care | Payer: Medicare HMO | Source: Ambulatory Visit | Attending: Gastroenterology | Admitting: Gastroenterology

## 2016-07-03 ENCOUNTER — Ambulatory Visit (HOSPITAL_COMMUNITY): Admit: 2016-07-03 | Payer: Medicare HMO | Admitting: Gastroenterology

## 2016-07-03 DIAGNOSIS — Z79899 Other long term (current) drug therapy: Secondary | ICD-10-CM | POA: Insufficient documentation

## 2016-07-03 DIAGNOSIS — I10 Essential (primary) hypertension: Secondary | ICD-10-CM | POA: Insufficient documentation

## 2016-07-03 DIAGNOSIS — Z7984 Long term (current) use of oral hypoglycemic drugs: Secondary | ICD-10-CM | POA: Diagnosis not present

## 2016-07-03 DIAGNOSIS — E669 Obesity, unspecified: Secondary | ICD-10-CM | POA: Insufficient documentation

## 2016-07-03 DIAGNOSIS — M1990 Unspecified osteoarthritis, unspecified site: Secondary | ICD-10-CM | POA: Diagnosis not present

## 2016-07-03 DIAGNOSIS — K5730 Diverticulosis of large intestine without perforation or abscess without bleeding: Secondary | ICD-10-CM | POA: Insufficient documentation

## 2016-07-03 DIAGNOSIS — Z87891 Personal history of nicotine dependence: Secondary | ICD-10-CM | POA: Insufficient documentation

## 2016-07-03 DIAGNOSIS — Z880 Allergy status to penicillin: Secondary | ICD-10-CM | POA: Insufficient documentation

## 2016-07-03 DIAGNOSIS — F419 Anxiety disorder, unspecified: Secondary | ICD-10-CM | POA: Diagnosis not present

## 2016-07-03 DIAGNOSIS — E119 Type 2 diabetes mellitus without complications: Secondary | ICD-10-CM | POA: Diagnosis not present

## 2016-07-03 DIAGNOSIS — F329 Major depressive disorder, single episode, unspecified: Secondary | ICD-10-CM | POA: Diagnosis not present

## 2016-07-03 DIAGNOSIS — Z1211 Encounter for screening for malignant neoplasm of colon: Secondary | ICD-10-CM | POA: Insufficient documentation

## 2016-07-03 HISTORY — PX: COLONOSCOPY WITH PROPOFOL: SHX5780

## 2016-07-03 LAB — GLUCOSE, CAPILLARY: GLUCOSE-CAPILLARY: 149 mg/dL — AB (ref 65–99)

## 2016-07-03 SURGERY — COLONOSCOPY WITH PROPOFOL
Anesthesia: Monitor Anesthesia Care

## 2016-07-03 MED ORDER — PROPOFOL 10 MG/ML IV BOLUS
INTRAVENOUS | Status: DC | PRN
  Administered 2016-07-03: 20 mg via INTRAVENOUS

## 2016-07-03 MED ORDER — PROPOFOL 10 MG/ML IV BOLUS
INTRAVENOUS | Status: AC
  Filled 2016-07-03: qty 40

## 2016-07-03 MED ORDER — LIDOCAINE 2% (20 MG/ML) 5 ML SYRINGE
INTRAMUSCULAR | Status: AC
Start: 2016-07-03 — End: 2016-07-03
  Filled 2016-07-03: qty 5

## 2016-07-03 MED ORDER — PROPOFOL 500 MG/50ML IV EMUL
INTRAVENOUS | Status: DC | PRN
  Administered 2016-07-03: 140 ug/kg/min via INTRAVENOUS

## 2016-07-03 MED ORDER — LACTATED RINGERS IV SOLN
INTRAVENOUS | Status: DC
  Administered 2016-07-03: 1000 mL via INTRAVENOUS

## 2016-07-03 MED ORDER — SODIUM CHLORIDE 0.9 % IV SOLN: INTRAVENOUS | Status: DC

## 2016-07-03 MED ORDER — LIDOCAINE 2% (20 MG/ML) 5 ML SYRINGE
INTRAMUSCULAR | Status: DC | PRN
  Administered 2016-07-03: 100 mg via INTRAVENOUS

## 2016-07-03 SURGICAL SUPPLY — 22 items

## 2016-07-03 NOTE — Op Note (Signed)
[facility] Patient Name: Connie Nichols Procedure Date: 07/03/2016 MRN: [id number] Attending MD: Carol Ada , MD Date of Birth: Mar 07, 1956 CSN: [id number] Age: 60 Admit Type: Outpatient Procedure:                Colonoscopy Indications:              Screening for colorectal malignant neoplasm Providers:                Carol Ada, MD, Cleda Daub, RN, Corliss Parish, Technician Referring MD:              Medicines:                Propofol per Anesthesia Complications:            No immediate complications. Estimated Blood Loss:     Estimated blood loss: none. Procedure:                Pre-Anesthesia Assessment:                           - Prior to the procedure, a History and Physical                            was performed, and patient medications and                            allergies were reviewed. The patient's tolerance of                            previous anesthesia was also reviewed. The risks                            and benefits of the procedure and the sedation                            options and risks were discussed with the patient.                            All questions were answered, and informed consent                            was obtained. Prior Anticoagulants: The patient has                            taken no previous anticoagulant or antiplatelet                            agents. ASA Grade Assessment: III - A patient with                            severe systemic disease. After reviewing the risks  and benefits, the patient was deemed in                            satisfactory condition to undergo the procedure.                           - Sedation was administered by an anesthesia                            professional. Deep sedation was attained.                           After obtaining informed consent, the colonoscope                            was passed under  direct vision. Throughout the                            procedure, the patient's blood pressure, pulse, and                            oxygen saturations were monitored continuously. The                            [id number] scope was introduced through                            the anus and advanced to the the cecum, identified                            by appendiceal orifice and ileocecal valve. The                            colonoscopy was performed without difficulty. The                            patient tolerated the procedure well. The quality                            of the bowel preparation was good. The ileocecal                            valve, appendiceal orifice, and rectum were                            photographed. Scope In: 9:24:57 AM Scope Out: 9:41:07 AM Scope Withdrawal Time: 0 hours 10 minutes 37 seconds  Total Procedure Duration: 0 hours 16 minutes 10 seconds  Findings:      Scattered small and large-mouthed diverticula were found in the entire       colon. Impression:               - Diverticulosis in the entire examined colon.                           -  No specimens collected. Moderate Sedation:      N/A- Per Anesthesia Care Recommendation:           - Patient has a contact number available for                            emergencies. The signs and symptoms of potential                            delayed complications were discussed with the                            patient. Return to normal activities tomorrow.                            Written discharge instructions were provided to the                            patient.                           - Resume previous diet.                           - Continue present medications.                           - Repeat colonoscopy in 10 years for surveillance. Procedure Code(s):        --- Professional ---                           [id number], Colorectal cancer screening; colonoscopy on                             individual not meeting criteria for high risk Diagnosis Code(s):        --- Professional ---                           Z12.11, Encounter for screening for malignant                            neoplasm of colon                           K57.30, Diverticulosis of large intestine without                            perforation or abscess without bleeding CPT copyright 2016 American Medical Association. All rights reserved. The codes documented in this report are preliminary and upon coder review may  be revised to meet current compliance requirements. Carol Ada, MD Carol Ada, MD 07/03/2016 9:46:18 AM This report has been signed electronically. Number of Addenda: 0

## 2016-07-03 NOTE — H&P (Signed)
Connie Nichols HPI: At this time the patient denies any problems with nausea, vomiting, fevers, chills, abdominal pain, diarrhea, constipation, hematochezia, melena, GERD, or dysphagia. The patient denies any known family history of colon cancers. No complaints of chest pain, SOB, MI, or sleep apnea. On 02/28/2007 the patient's screening colonoscopy was normal.  Past Medical History:  Diagnosis Date  . Anxiety   . Arthritis   . Complication of anesthesia   . Depression   . Diabetes mellitus    ONLY  ORAL MEDS  . Heart murmur    NEVER SEEN A CARDIOLOGIST.  STRESS YEARS AGO IN [place] ... NORMAL .   Marland Kitchen Hypertension   . Neuromuscular disorder ([place])    DUE TO NECK PAIN AND RADIATING TO  RIGHT SHOULDER  . Obesity, mild   . PONV (postoperative nausea and vomiting)    PT STATES "MORPHINE MAKES ME SICK AS A DOG.     Past Surgical History:  Procedure Laterality Date  . ABDOMINAL HYSTERECTOMY     partial  . ANTERIOR CERVICAL DECOMP/DISCECTOMY FUSION  09/08/2011   Procedure: ANTERIOR CERVICAL DECOMPRESSION/DISCECTOMY FUSION 2 LEVELS;  Surgeon: Earleen Newport;  Location: [place] [facility];  Service: Neurosurgery;  Laterality: N/A;  Cervical four-five, five-six anterior cervical decompression fusion  . CESAREAN SECTION  31 yrs ago  . DILATION AND CURETTAGE OF UTERUS    . KNEE ARTHROSCOPY Left   . TUBAL LIGATION      Family History  Problem Relation Age of Onset  . Diabetes Mellitus II Mother   . Hypertension Mother   . Hypertension Father   . Diabetes Mellitus II Father   . Heart disease Sister     Social History:  reports that she quit smoking about 32 years ago. Her smoking use included Cigarettes. She has a 9.00 pack-year smoking history. She has never used smokeless tobacco. She reports that she drinks alcohol. She reports that she does not use drugs.  Allergies:  Allergies  Allergen Reactions  . Aspirin Other (See Comments)    Upset gi.  . Morphine And Related Nausea And  Vomiting  . Penicillins Cross Reactors Other (See Comments)    unknown    Medications: Scheduled: Continuous:  No results found for this or any previous visit (from the past 24 hour(s)).   No results found.  ROS:  As stated above in the HPI otherwise negative.  There were no vitals taken for this visit.    PE: Gen: NAD, Alert and Oriented HEENT:  [employer] [employer]/AT, EOMI Neck: Supple, no LAD Lungs: CTA Bilaterally CV: RRR without M/G/R ABM: Soft, NTND, +BS Ext: No C/C/E  Assessment/Plan: 1) Screening colonoscopy.  Connie Nichols D 07/03/2016, 8:11 AM

## 2016-07-03 NOTE — Anesthesia Preprocedure Evaluation (Signed)
Anesthesia Evaluation  Patient identified by MRN, date of birth, ID band Patient awake    Reviewed: Allergy & Precautions, NPO status , Patient's Chart, lab work & pertinent test results  History of Anesthesia Complications (+) PONV  Airway Mallampati: I  TM Distance: >3 FB Neck ROM: Full    Dental   Pulmonary former smoker,    Pulmonary exam normal        Cardiovascular hypertension, Pt. on medications Normal cardiovascular exam     Neuro/Psych    GI/Hepatic   Endo/Other  diabetes, Type 2, Oral Hypoglycemic Agents  Renal/GU      Musculoskeletal   Abdominal   Peds  Hematology   Anesthesia Other Findings   Reproductive/Obstetrics                             Anesthesia Physical Anesthesia Plan  ASA: II  Anesthesia Plan: MAC   Post-op Pain Management:    Induction: Intravenous  Airway Management Planned: Simple Face Mask  Additional Equipment:   Intra-op Plan:   Post-operative Plan:   Informed Consent: I have reviewed the patients History and Physical, chart, labs and discussed the procedure including the risks, benefits and alternatives for the proposed anesthesia with the patient or authorized representative who has indicated his/her understanding and acceptance.     Plan Discussed with: CRNA and Surgeon  Anesthesia Plan Comments:         Anesthesia Quick Evaluation

## 2016-07-03 NOTE — Discharge Instructions (Signed)

## 2016-07-03 NOTE — Anesthesia Postprocedure Evaluation (Signed)
Anesthesia Post Note  Patient: Connie Nichols  Procedure(s) Performed: Procedure(s) (LRB): COLONOSCOPY WITH PROPOFOL (N/A)  Patient location during evaluation: PACU Anesthesia Type: MAC Level of consciousness: awake and alert Pain management: pain level controlled Vital Signs Assessment: post-procedure vital signs reviewed and stable Respiratory status: spontaneous breathing, nonlabored ventilation, respiratory function stable and patient connected to nasal cannula oxygen Cardiovascular status: stable and blood pressure returned to baseline Anesthetic complications: no    Last Vitals:  Vitals:   07/03/16 0948 07/03/16 1015  BP: (!) 121/57 134/79  Pulse: 65 64  Resp: 15 14  Temp: 36.4 C     Last Pain:  Vitals:   07/03/16 0948  TempSrc: Oral                 Clairessa Boulet DAVID

## 2016-07-03 NOTE — Transfer of Care (Signed)
Immediate Anesthesia Transfer of Care Note  Patient: Connie Nichols  Procedure(s) Performed: Procedure(s): COLONOSCOPY WITH PROPOFOL (N/A)  Patient Location: Endoscopy Unit  Anesthesia Type:MAC  Level of Consciousness: awake  Airway & Oxygen Therapy: Patient Spontanous Breathing and Patient connected to face mask oxygen  Post-op Assessment: Report given to RN and Post -op Vital signs reviewed and stable  Post vital signs: Reviewed and stable  Last Vitals:  Vitals:   07/03/16 0828  BP: (!) 148/79  Pulse: 69  Resp: 11  Temp: 36.5 C    Last Pain:  Vitals:   07/03/16 0828  TempSrc: Oral         Complications: No apparent anesthesia complications

## 2016-07-06 ENCOUNTER — Encounter (HOSPITAL_COMMUNITY): Payer: Self-pay | Admitting: Gastroenterology

## 2018-03-01 ENCOUNTER — Other Ambulatory Visit: Payer: Self-pay | Admitting: Obstetrics and Gynecology

## 2018-03-01 DIAGNOSIS — N6320 Unspecified lump in the left breast, unspecified quadrant: Secondary | ICD-10-CM

## 2018-03-04 ENCOUNTER — Ambulatory Visit
Admission: RE | Admit: 2018-03-04 | Discharge: 2018-03-04 | Disposition: A | Discharge status: Home / Self Care | Payer: Medicare HMO | Source: Ambulatory Visit | Attending: Obstetrics and Gynecology | Admitting: Obstetrics and Gynecology

## 2018-03-04 DIAGNOSIS — N6320 Unspecified lump in the left breast, unspecified quadrant: Secondary | ICD-10-CM

## 2018-04-29 ENCOUNTER — Other Ambulatory Visit: Payer: Self-pay | Admitting: Obstetrics and Gynecology

## 2018-04-29 DIAGNOSIS — E2839 Other primary ovarian failure: Secondary | ICD-10-CM

## 2018-06-23 ENCOUNTER — Ambulatory Visit
Admission: RE | Admit: 2018-06-23 | Discharge: 2018-06-23 | Disposition: A | Discharge status: Home / Self Care | Payer: Medicare HMO | Source: Ambulatory Visit | Attending: Obstetrics and Gynecology | Admitting: Obstetrics and Gynecology

## 2018-06-23 DIAGNOSIS — E2839 Other primary ovarian failure: Secondary | ICD-10-CM

## 2019-06-14 DIAGNOSIS — H6123 Impacted cerumen, bilateral: Secondary | ICD-10-CM | POA: Insufficient documentation

## 2019-08-29 DIAGNOSIS — E782 Mixed hyperlipidemia: Secondary | ICD-10-CM | POA: Insufficient documentation

## 2019-08-29 DIAGNOSIS — E119 Type 2 diabetes mellitus without complications: Secondary | ICD-10-CM | POA: Insufficient documentation

## 2019-08-29 DIAGNOSIS — E6609 Other obesity due to excess calories: Secondary | ICD-10-CM | POA: Insufficient documentation

## 2019-10-31 DIAGNOSIS — M1732 Unilateral post-traumatic osteoarthritis, left knee: Secondary | ICD-10-CM | POA: Insufficient documentation

## 2020-02-07 DIAGNOSIS — E118 Type 2 diabetes mellitus with unspecified complications: Secondary | ICD-10-CM | POA: Insufficient documentation

## 2020-08-01 ENCOUNTER — Encounter: Payer: Self-pay | Admitting: Podiatry

## 2020-08-01 ENCOUNTER — Other Ambulatory Visit: Payer: Self-pay

## 2020-08-01 ENCOUNTER — Ambulatory Visit: Payer: Medicare Other | Admitting: Podiatry

## 2020-08-01 ENCOUNTER — Ambulatory Visit: Payer: Medicare Other

## 2020-08-01 DIAGNOSIS — E119 Type 2 diabetes mellitus without complications: Secondary | ICD-10-CM | POA: Insufficient documentation

## 2020-08-01 DIAGNOSIS — M67471 Ganglion, right ankle and foot: Secondary | ICD-10-CM

## 2020-08-01 DIAGNOSIS — M25579 Pain in unspecified ankle and joints of unspecified foot: Secondary | ICD-10-CM | POA: Insufficient documentation

## 2020-08-01 DIAGNOSIS — M7730 Calcaneal spur, unspecified foot: Secondary | ICD-10-CM | POA: Insufficient documentation

## 2020-08-01 DIAGNOSIS — M545 Low back pain, unspecified: Secondary | ICD-10-CM | POA: Insufficient documentation

## 2020-08-01 DIAGNOSIS — I10 Essential (primary) hypertension: Secondary | ICD-10-CM | POA: Insufficient documentation

## 2020-08-03 NOTE — Progress Notes (Signed)
Subjective:  Patient ID: Connie Nichols, female    DOB: 1956/08/18,  MRN: [id number] HPI Chief Complaint  Patient presents with  . Ankle Pain    Lateral ankle right - says she has a ganglion cyst, had xrays, ultrasound and MRI (brought disc today), she is visiting her daughter that lives here until Jan. 2022, she currently lives in [place]  . New Patient (Initial Visit)    64 y.o. female presents with the above complaint.   ROS: Denies fever chills nausea vomiting muscle aches pains calf pain back pain chest pain shortness of breath.  Past Medical History:  Diagnosis Date  . Anxiety   . Arthritis   . Complication of anesthesia   . Depression   . Diabetes mellitus    ONLY  ORAL MEDS  . Heart murmur    NEVER SEEN A CARDIOLOGIST.  STRESS YEARS AGO IN [place] ... NORMAL .   Marland Kitchen Hypertension   . 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 Obesity, mild   . PONV (postoperative nausea and vomiting)    PT STATES "MORPHINE MAKES ME SICK AS A DOG.    Past Surgical History:  Procedure Laterality Date  . ABDOMINAL HYSTERECTOMY     partial  . ANTERIOR CERVICAL DECOMP/DISCECTOMY FUSION  09/08/2011   Procedure: ANTERIOR CERVICAL DECOMPRESSION/DISCECTOMY FUSION 2 LEVELS;  Surgeon: Earleen Newport;  Location: [facility];  Service: Neurosurgery;  Laterality: N/A;  Cervical four-five, five-six anterior cervical decompression fusion  . CESAREAN SECTION  31 yrs ago  . COLONOSCOPY WITH PROPOFOL N/A 07/03/2016   Procedure: COLONOSCOPY WITH PROPOFOL;  Surgeon: Carol Ada, MD;  Location: [facility];  Service: Endoscopy;  Laterality: N/A;  . DILATION AND CURETTAGE OF UTERUS    . KNEE ARTHROSCOPY Left   . TUBAL LIGATION      Current Outpatient Medications:  .  atorvastatin (LIPITOR) 20 MG tablet, Take 1 tablet by mouth daily., Disp: , Rfl:  .  JARDIANCE 25 MG TABS tablet, Take 25 mg by mouth daily., Disp: , Rfl:  .  meloxicam (MOBIC) 15 MG tablet,  Take 1 tablet (15 mg total) by mouth daily., Disp: 30 tablet, Rfl: 3 .  metFORMIN (GLUCOPHAGE) 850 MG tablet, Take 1 tablet by mouth 2 (two) times daily., Disp: , Rfl:  .  valsartan-hydrochlorothiazide (DIOVAN-HCT) 160-12.5 MG tablet, Take 1 tablet by mouth daily., Disp: , Rfl: 2 .  VICTOZA 18 MG/3ML SOPN, 0.6 mg daily., Disp: , Rfl:   Allergies  Allergen Reactions  . Aspirin Other (See Comments)    Upset gi.  . Morphine And Related Nausea And Vomiting  . Penicillins Nausea And Vomiting    Other reaction(s): GI Upset (intolerance) unknown unknown   . Penicillins Cross Reactors Other (See Comments)    unknown   Review of Systems Objective:  There were no vitals filed for this visit.  General: Well developed, nourished, in no acute distress, alert and oriented x3   Dermatological: Skin is warm, dry and supple bilateral. Nails x 10 are well maintained; remaining integument appears unremarkable at this time. There are no open sores, no preulcerative lesions, no rash or signs of infection present.  Vascular: Dorsalis Pedis artery and Posterior Tibial artery pedal pulses are 2/4 bilateral with immedate capillary fill time. Pedal hair growth present. No varicosities and no lower extremity edema present bilateral.   Neruologic: Grossly intact via light touch bilateral. Vibratory intact via tuning fork  bilateral. Protective threshold with Semmes Wienstein monofilament intact to all pedal sites bilateral. Patellar and Achilles deep tendon reflexes 2+ bilateral. No Babinski or clonus noted bilateral.   Musculoskeletal: No gross boney pedal deformities bilateral. No pain, crepitus, or limitation noted with foot and ankle range of motion bilateral. Muscular strength 5/5 in all groups tested bilateral.  There is a small cystic lesion just distal to the tip of the lateral malleolus distal margin of this lesion appears to be very firm as if they were sitting on the ledge of the peroneal tubercle.   However there is an area of fluctuance measures about a centimeter in diameter overlying the peroneal tendons.  This is most likely what we are visualizing on the MRI.  Gait: Unassisted, Nonantalgic.    Radiographs:  She presents with her MRI findings today and is I reviewed the MRI there appears to be a area of fluid collection beneath the peroneal tendons just beneath the lateral malleolus.  There is a discrete capsule there is no lobulation.  Assessment & Plan:   Assessment: Ganglion cyst and what appears to be a hypertrophic peroneal tubercle.  Plan: Discussed surgical intervention with her.  At this point we consented her for excision of the cyst.  Also discussed with her removing a portion of the peroneal tubercle if it is resulting in the cyst formation.  We discussed discussed the possible side effects postop complications which may include but not limited to postop pain bleeding swelling infection recurrence need for further surgery overcorrection under correction delayed wound healing loss of digit loss of limb loss of life.  She signed the consent form today we dispensed cam boot and I will follow-up with her in the near future for surgical intervention.     Hameed Kolar T. [place], [place]

## 2020-08-05 ENCOUNTER — Encounter: Payer: Self-pay | Admitting: Podiatry

## 2020-08-06 ENCOUNTER — Telehealth: Payer: Self-pay | Admitting: Podiatry

## 2020-08-06 NOTE — Telephone Encounter (Signed)
DOS: 08/30/2020  Procedure: Exc. Ganglion/Tumor Rt [phone])  Medicare Part A & B Primary [employer] Secondary  Notification or Prior Authorization is not required for the requested services  This [employer] Medicare Advantage members plan does not currently require a prior authorization for these services. If you have general questions about the prior authorization requirements, please call us at [phone] or visit [email or web address] > Clinician Resources > Advance and Admission Notification Requirements. The number above acknowledges your notification. Please write this number down for future reference. Notification is not a guarantee of coverage or payment.  Decision ID #:[id number]  The number above acknowledges your inquiry and our response. Please write this number down and refer to it for future inquiries. Coverage and payment for an item or service is governed by the member's benefit plan document, and, if applicable, the provider's participation agreement with the Health Plan.

## 2020-08-08 ENCOUNTER — Telehealth: Payer: Self-pay

## 2020-08-08 NOTE — Telephone Encounter (Signed)
Connie Nichols called to cancel her surgery with Dr. Milinda Pointer on 08/30/2020. She stated her PCP that is in [place] ,where she currently lives,  will not give her medical clearance until she is seen again. Notified Caren Griffins with [facility] and Dr. Milinda Pointer

## 2020-08-08 NOTE — Telephone Encounter (Signed)
ok 

## 2020-08-09 ENCOUNTER — Encounter: Payer: Self-pay | Admitting: Podiatry

## 2020-08-28 ENCOUNTER — Other Ambulatory Visit: Payer: Self-pay | Admitting: Podiatry

## 2020-08-28 ENCOUNTER — Telehealth: Payer: Self-pay

## 2020-08-28 MED ORDER — PROMETHAZINE HCL 25 MG PO TABS: 25.0000 mg | ORAL_TABLET | Freq: Three times a day (TID) | ORAL | 0 refills | Status: AC | PRN

## 2020-08-28 MED ORDER — CLINDAMYCIN HCL 150 MG PO CAPS: 150.0000 mg | ORAL_CAPSULE | Freq: Three times a day (TID) | ORAL | 0 refills | Status: AC

## 2020-08-28 MED ORDER — ONDANSETRON HCL 4 MG PO TABS: 4.0000 mg | ORAL_TABLET | Freq: Three times a day (TID) | ORAL | 0 refills | Status: AC | PRN

## 2020-08-28 MED ORDER — HYDROCODONE-ACETAMINOPHEN 10-325 MG PO TABS: 1.0000 | ORAL_TABLET | Freq: Four times a day (QID) | ORAL | 0 refills | Status: AC | PRN

## 2020-08-28 NOTE — Telephone Encounter (Signed)
Connie Nichols is having surgery with Dr. Al Corpus on 08/30/2020. She stated her prescriptions were called into the wrong pharmacy. She would like them sent to [facility] on [address]. She also stated the the Zofran was $80.00 and would like to know if something cheaper could be called in.

## 2020-08-28 NOTE — Telephone Encounter (Signed)
I cant find this one on [address].  Also please cancel the previous RXs and resend.  Thanks.

## 2020-08-30 DIAGNOSIS — M67471 Ganglion, right ankle and foot: Secondary | ICD-10-CM | POA: Diagnosis not present

## 2020-09-02 ENCOUNTER — Telehealth (INDEPENDENT_AMBULATORY_CARE_PROVIDER_SITE_OTHER): Payer: Medicare Other | Admitting: Podiatry

## 2020-09-02 NOTE — Telephone Encounter (Signed)
Patient had surgery on Friday. She has questions. Please call her at [phone].

## 2020-09-02 NOTE — Telephone Encounter (Signed)
Called patient -   She is requesting to fly back to [place] immediately. When questioned if she told this to Dr. Al Corpus prior to her surgery, she says she did not. I did discuss this with Dr. Al Corpus while patient was on hold and he recommended she stay at least until she got her stitches out which will be in 2 weeks, otherwise she would need to sign a release stating she is leaving against medical advice. I relayed this information to the patient and she said she could wait until her stitches come out. Confirmed appointments with patient for this week and next. She demonstrated verbal understanding of our conversation.

## 2020-09-05 ENCOUNTER — Ambulatory Visit (INDEPENDENT_AMBULATORY_CARE_PROVIDER_SITE_OTHER): Payer: Medicare Other | Admitting: Podiatry

## 2020-09-05 ENCOUNTER — Other Ambulatory Visit: Payer: Self-pay

## 2020-09-05 ENCOUNTER — Encounter: Payer: Medicare Other | Admitting: Podiatry

## 2020-09-05 DIAGNOSIS — M67471 Ganglion, right ankle and foot: Secondary | ICD-10-CM

## 2020-09-05 NOTE — Progress Notes (Signed)
She presents today for follow-up of her right foot.  States that she is ready to leave and go back to [place].  She is states that the foot has not hurt at all she has been using her cam walker regularly.  Objective: Vital signs are stable she is alert and oriented x3.  There is no erythema edema cellulitis drainage or odor dressed her dressing was removed today and appears to be intact sutures are intact margins well coapted.  Assessment: Well-healed surgical foot exostectomy peroneal tubercle right x1 week.  Plan: Follow-up with me in 1 week in the [place] office for suture removal.

## 2020-09-10 ENCOUNTER — Telehealth: Payer: Self-pay | Admitting: *Deleted

## 2020-09-10 NOTE — Telephone Encounter (Signed)
Patient has an appointment Dr. Milinda Pointer tomorrow for 2nd post op but has been exposed to Covid, going back to [place] tomorrow.

## 2020-09-10 NOTE — Telephone Encounter (Signed)
Dr. Milinda Pointer said this patient needs to keep this appt. Have patient stay in her car and I can go out and get her.

## 2020-09-11 ENCOUNTER — Ambulatory Visit (INDEPENDENT_AMBULATORY_CARE_PROVIDER_SITE_OTHER): Payer: Medicare Other | Admitting: Podiatry

## 2020-09-11 ENCOUNTER — Other Ambulatory Visit: Payer: Self-pay

## 2020-09-11 DIAGNOSIS — M67471 Ganglion, right ankle and foot: Secondary | ICD-10-CM

## 2020-09-11 DIAGNOSIS — Z9889 Other specified postprocedural states: Secondary | ICD-10-CM

## 2020-09-11 NOTE — Progress Notes (Signed)
She was seen by Caryl Pina today since patient has been exposed to [place] and actually is just recovering from [place].  Patient is status post peroneal tubercle resection evaluation of peroneal tendons and cyst.  Patient is stating that she has no pain whatsoever.  Objective: According to [facility] the dressing was dry and intact once removed demonstrates no erythema cellulitis drainage or odor margins are well coapted sutures were removed today margins remain well coapted.  Assessment: Well-healing surgical foot.  Placed her in a compression anklet.  Plan: She will follow-up with [place] on an as-needed basis she will be leaving for [place] tomorrow morning.

## 2020-09-17 ENCOUNTER — Encounter: Payer: Medicare Other | Admitting: Podiatry

## 2020-10-01 ENCOUNTER — Encounter: Payer: Medicare Other | Admitting: Podiatry

## 2020-10-15 ENCOUNTER — Encounter: Payer: Medicare Other | Admitting: Podiatry
